# Patient Record
Sex: Female | Born: 1980 | Race: White | Hispanic: No | Smoking: Former smoker
Health system: Southern US, Community
[De-identification: ages and names within clinical notes are randomized; demographics above are authoritative.]

## PROBLEM LIST (undated history)

## (undated) DIAGNOSIS — N946 Dysmenorrhea, unspecified: Secondary | ICD-10-CM

## (undated) DIAGNOSIS — G43909 Migraine, unspecified, not intractable, without status migrainosus: Secondary | ICD-10-CM

## (undated) DIAGNOSIS — R51 Headache: Secondary | ICD-10-CM

## (undated) HISTORY — DX: Migraine, unspecified, not intractable, without status migrainosus: G43.909

## (undated) HISTORY — DX: Dysmenorrhea, unspecified: N94.6

---
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
## 2000-05-29 ENCOUNTER — Emergency Department (HOSPITAL_COMMUNITY): Admission: EM | Admit: 2000-05-29 | Discharge: 2000-05-29 | Payer: Self-pay | Admitting: Emergency Medicine

## 2000-07-09 ENCOUNTER — Emergency Department (HOSPITAL_COMMUNITY): Admission: EM | Admit: 2000-07-09 | Discharge: 2000-07-09 | Payer: Self-pay | Admitting: Emergency Medicine

## 2000-10-01 ENCOUNTER — Encounter: Payer: Self-pay | Admitting: Emergency Medicine

## 2000-10-01 ENCOUNTER — Inpatient Hospital Stay (HOSPITAL_COMMUNITY): Admission: AD | Admit: 2000-10-01 | Discharge: 2000-10-01 | Payer: Self-pay | Admitting: *Deleted

## 2000-10-04 ENCOUNTER — Other Ambulatory Visit: Admission: RE | Admit: 2000-10-04 | Discharge: 2000-10-04 | Payer: Self-pay | Admitting: *Deleted

## 2000-12-04 ENCOUNTER — Emergency Department (HOSPITAL_COMMUNITY): Admission: EM | Admit: 2000-12-04 | Discharge: 2000-12-04 | Payer: Self-pay | Admitting: Emergency Medicine

## 2001-01-21 ENCOUNTER — Ambulatory Visit (HOSPITAL_COMMUNITY): Admission: RE | Admit: 2001-01-21 | Discharge: 2001-01-21 | Payer: Self-pay

## 2001-02-17 ENCOUNTER — Inpatient Hospital Stay (HOSPITAL_COMMUNITY): Admission: AD | Admit: 2001-02-17 | Discharge: 2001-02-17 | Payer: Self-pay | Admitting: Obstetrics & Gynecology

## 2001-03-21 ENCOUNTER — Ambulatory Visit (HOSPITAL_COMMUNITY): Admission: RE | Admit: 2001-03-21 | Discharge: 2001-03-21 | Payer: Self-pay | Admitting: *Deleted

## 2001-05-07 ENCOUNTER — Inpatient Hospital Stay (HOSPITAL_COMMUNITY): Admission: AD | Admit: 2001-05-07 | Discharge: 2001-05-07 | Payer: Self-pay | Admitting: *Deleted

## 2001-06-20 ENCOUNTER — Inpatient Hospital Stay (HOSPITAL_COMMUNITY): Admission: RE | Admit: 2001-06-20 | Discharge: 2001-06-24 | Payer: Self-pay | Admitting: Obstetrics & Gynecology

## 2001-06-27 ENCOUNTER — Inpatient Hospital Stay (HOSPITAL_COMMUNITY): Admission: AD | Admit: 2001-06-27 | Discharge: 2001-06-27 | Payer: Self-pay | Admitting: Obstetrics & Gynecology

## 2001-07-07 ENCOUNTER — Encounter: Admission: RE | Admit: 2001-07-07 | Discharge: 2001-07-07 | Payer: Self-pay | Admitting: Obstetrics

## 2001-07-19 ENCOUNTER — Ambulatory Visit (HOSPITAL_COMMUNITY): Admission: RE | Admit: 2001-07-19 | Discharge: 2001-07-19 | Payer: Self-pay | Admitting: Obstetrics

## 2001-07-21 ENCOUNTER — Encounter: Admission: RE | Admit: 2001-07-21 | Discharge: 2001-07-21 | Payer: Self-pay | Admitting: Obstetrics

## 2001-07-30 ENCOUNTER — Encounter: Payer: Self-pay | Admitting: *Deleted

## 2001-07-30 ENCOUNTER — Inpatient Hospital Stay (HOSPITAL_COMMUNITY): Admission: AD | Admit: 2001-07-30 | Discharge: 2001-07-30 | Payer: Self-pay | Admitting: *Deleted

## 2001-07-31 ENCOUNTER — Observation Stay (HOSPITAL_COMMUNITY): Admission: AD | Admit: 2001-07-31 | Discharge: 2001-07-31 | Payer: Self-pay | Admitting: *Deleted

## 2001-08-11 ENCOUNTER — Encounter: Admission: RE | Admit: 2001-08-11 | Discharge: 2001-08-11 | Payer: Self-pay | Admitting: Obstetrics

## 2001-08-16 ENCOUNTER — Inpatient Hospital Stay (HOSPITAL_COMMUNITY): Admission: AD | Admit: 2001-08-16 | Discharge: 2001-08-16 | Payer: Self-pay | Admitting: *Deleted

## 2001-08-18 ENCOUNTER — Encounter: Admission: RE | Admit: 2001-08-18 | Discharge: 2001-08-18 | Payer: Self-pay | Admitting: Obstetrics

## 2001-08-25 ENCOUNTER — Encounter: Admission: RE | Admit: 2001-08-25 | Discharge: 2001-08-25 | Payer: Self-pay | Admitting: Obstetrics

## 2001-08-25 ENCOUNTER — Inpatient Hospital Stay (HOSPITAL_COMMUNITY): Admission: AD | Admit: 2001-08-25 | Discharge: 2001-08-25 | Payer: Self-pay | Admitting: Obstetrics & Gynecology

## 2001-08-28 ENCOUNTER — Inpatient Hospital Stay (HOSPITAL_COMMUNITY): Admission: AD | Admit: 2001-08-28 | Discharge: 2001-08-30 | Payer: Self-pay | Admitting: Obstetrics

## 2001-10-05 ENCOUNTER — Inpatient Hospital Stay (HOSPITAL_COMMUNITY): Admission: AD | Admit: 2001-10-05 | Discharge: 2001-10-05 | Payer: Self-pay | Admitting: *Deleted

## 2001-10-28 ENCOUNTER — Inpatient Hospital Stay (HOSPITAL_COMMUNITY): Admission: AD | Admit: 2001-10-28 | Discharge: 2001-10-28 | Payer: Self-pay | Admitting: *Deleted

## 2001-12-21 ENCOUNTER — Inpatient Hospital Stay (HOSPITAL_COMMUNITY): Admission: AD | Admit: 2001-12-21 | Discharge: 2001-12-21 | Payer: Self-pay | Admitting: Obstetrics and Gynecology

## 2002-09-13 ENCOUNTER — Other Ambulatory Visit: Admission: RE | Admit: 2002-09-13 | Discharge: 2002-09-13 | Payer: Self-pay | Admitting: Gynecology

## 2003-06-07 ENCOUNTER — Observation Stay (HOSPITAL_COMMUNITY): Admission: AD | Admit: 2003-06-07 | Discharge: 2003-06-08 | Payer: Self-pay | Admitting: Obstetrics and Gynecology

## 2003-09-01 HISTORY — PX: TUBAL LIGATION: SHX77

## 2003-12-08 ENCOUNTER — Inpatient Hospital Stay (HOSPITAL_COMMUNITY): Admission: AD | Admit: 2003-12-08 | Discharge: 2003-12-10 | Payer: Self-pay | Admitting: Obstetrics and Gynecology

## 2003-12-08 ENCOUNTER — Encounter (INDEPENDENT_AMBULATORY_CARE_PROVIDER_SITE_OTHER): Payer: Self-pay | Admitting: Specialist

## 2004-08-29 ENCOUNTER — Emergency Department (HOSPITAL_COMMUNITY): Admission: EM | Admit: 2004-08-29 | Discharge: 2004-08-29 | Payer: Self-pay | Admitting: Emergency Medicine

## 2005-02-17 ENCOUNTER — Emergency Department (HOSPITAL_COMMUNITY): Admission: EM | Admit: 2005-02-17 | Discharge: 2005-02-17 | Payer: Self-pay | Admitting: Emergency Medicine

## 2005-10-30 ENCOUNTER — Emergency Department: Payer: Self-pay | Admitting: Emergency Medicine

## 2007-03-03 ENCOUNTER — Ambulatory Visit: Payer: Self-pay | Admitting: Internal Medicine

## 2007-04-07 ENCOUNTER — Encounter (INDEPENDENT_AMBULATORY_CARE_PROVIDER_SITE_OTHER): Payer: Self-pay | Admitting: Nurse Practitioner

## 2007-04-07 ENCOUNTER — Ambulatory Visit: Payer: Self-pay | Admitting: Internal Medicine

## 2007-04-13 ENCOUNTER — Ambulatory Visit: Payer: Self-pay | Admitting: Internal Medicine

## 2007-04-21 ENCOUNTER — Ambulatory Visit: Payer: Self-pay | Admitting: Internal Medicine

## 2007-05-24 ENCOUNTER — Ambulatory Visit: Payer: Self-pay | Admitting: Nurse Practitioner

## 2007-05-25 ENCOUNTER — Encounter (INDEPENDENT_AMBULATORY_CARE_PROVIDER_SITE_OTHER): Payer: Self-pay | Admitting: Nurse Practitioner

## 2007-05-25 LAB — CONVERTED CEMR LAB: GC Probe Amp, Genital: NEGATIVE

## 2007-06-03 ENCOUNTER — Encounter (INDEPENDENT_AMBULATORY_CARE_PROVIDER_SITE_OTHER): Payer: Self-pay | Admitting: Nurse Practitioner

## 2007-08-09 ENCOUNTER — Ambulatory Visit: Payer: Self-pay | Admitting: Internal Medicine

## 2007-08-09 LAB — CONVERTED CEMR LAB: Rapid Strep: NEGATIVE

## 2007-10-27 ENCOUNTER — Emergency Department: Payer: Self-pay | Admitting: Emergency Medicine

## 2008-01-09 ENCOUNTER — Emergency Department (HOSPITAL_COMMUNITY): Admission: EM | Admit: 2008-01-09 | Discharge: 2008-01-09 | Payer: Self-pay | Admitting: Emergency Medicine

## 2009-03-18 ENCOUNTER — Emergency Department (HOSPITAL_COMMUNITY): Admission: EM | Admit: 2009-03-18 | Discharge: 2009-03-18 | Payer: Self-pay | Admitting: Emergency Medicine

## 2009-07-09 ENCOUNTER — Emergency Department: Payer: Self-pay | Admitting: Emergency Medicine

## 2010-02-02 ENCOUNTER — Emergency Department: Payer: Self-pay | Admitting: Emergency Medicine

## 2010-09-17 ENCOUNTER — Emergency Department: Payer: Self-pay | Admitting: Emergency Medicine

## 2010-09-20 ENCOUNTER — Emergency Department: Payer: Self-pay | Admitting: Emergency Medicine

## 2010-09-28 LAB — CONVERTED CEMR LAB
Beta hcg, urine, semiquantitative: NEGATIVE
Bilirubin Urine: NEGATIVE
Blood in Urine, dipstick: NEGATIVE
Protein, U semiquant: 30
Urobilinogen, UA: 0.2

## 2011-01-16 NOTE — Discharge Summary (Signed)
   NAME:  TRANNIE, BARDALES                          ACCOUNT NO.:  [id number]   MEDICAL RECORD NO.:  [id number]                   PATIENT TYPE:  INP   LOCATION:  9142                                 FACILITY:  [facility]   PHYSICIAN:  Gerrit Friends. Aldona Bar, M.D.                DATE OF BIRTH:  1981/02/19   DATE OF ADMISSION:  06/07/2003  DATE OF DISCHARGE:  06/08/2003                                 DISCHARGE SUMMARY   DISCHARGE DIAGNOSES:  1. An 11-12 week intrauterine pregnancy, undelivered.  2. Nausea, vomiting.   SUMMARY:  This 30 year old gravida 2, para 1 was admitted on the afternoon  of 06/07/2003 with nausea and vomiting for three days with a history of  hyperemesis during her first pregnancy.  Her pregnancy was documented by an  ultrasound at 8-weeks gestation.  Her EDC is April, 24, 2005.  Her  evaluation on admission revealed some ketones in her urine but otherwise her  laboratory parameters were within normal limits.  She was begun on  intravenous fluids.  A TSH and T4 were done both of which were normal.  She  did respond to IV fluids and IV Zofran.  On the morning of June 08, 2003,  her diet was increased and was tolerated well by the patient.  She required  no further Zofran on June 08, 2003.  On the evening of June 08, 2003,  again she was tolerating an advanced diet well with no nausea or vomiting  and was ready for discharge.  Accordingly she was given all appropriate  instructions.  She has followup already arranged in the office.  She was  given a prescription for Phenergan 25 mg rectal suppository, should she  begin having any nausea and vomiting, to use one per rectum every six to  eight hours as needed for severe nausea and vomiting.  She was given six  with one refill.   CONDITION ON DISCHARGE:  Improved.                                               Gerrit Friends. Aldona Bar, M.D.    RMW/MEDQ  D:  06/08/2003  T:  06/08/2003  Job:  [id number]

## 2011-01-16 NOTE — Discharge Summary (Signed)
[facility]  Patient:    Carolyn Chambers, Carolyn Chambers Visit Number: [id number] MRN: [id number]          Service Type: OBS Location: 910D [id number] 01 Attending Physician:  Antionette Char Dictated by:   Andrey Spearman Admit Date:  06/20/2001 Disc. Date: 06/24/01                             Discharge Summary  DISCHARGE DIAGNOSES:          1. Preterm contractions.                               2. Intrauterine pregnancy at 31-4/7 weeks by                                  a 9-week and 18-week ultrasound.  DISCHARGE MEDICATIONS:        1. Terbutaline 2.5 mg one p.o. q.4h.                               2. Prenatal vitamins one p.o. q.d.  BRIEF ADMISSION HISTORY AND PHYSICAL:                 This patient is a 30 year old, G1, P0, who presented at 31 weeks 0 days estimated gestational age, by a 9-week and 18-week ultrasound, complaining of contractions with intact membranes and good fetal activity. She had been cared for at [facility] with a fairly unremarkable prenatal course including a negative group B strep done on September 17.  PAST MEDICAL AND SURGICAL HISTORY:             Significant for possible history of depression.  SOCIAL HISTORY:               Significant for tobacco abuse.  PHYSICAL EXAMINATION:         Her initial exam revealed her blood pressure to be 109/62. Her cervix was fingertip and thick with a developed lower uterine segment with fetal heart rate baseline in the 140s but was reactive with no decelerations and infrequent contractions on tocometer. She had a wet prep done which was negative. Specifically, it showed few white blood cells, many bacteria, and no Trichomonas, yeast, or clue cells. She also had a urinalysis done by catheterization which was normal. However, given the lower uterine segment development, she was admitted for 23-hour observation, hydration, and tocolytics including Procardia.  HOSPITAL COURSE: #1 - PRETERM  CONTRACTIONS:    The patient was initially started on Procardia XL, however, she was unable to swallow pills so this was changed on hospital day #1 to terbutaline which she is able to chew, taking 2.5 mg initially every six hours after subcutaneous loading dose. Throughout the entire hospitalization, the patient reported no further contractions although she had felt contractions on admission. During the hospitalization, she had irregular contractile patterns, typically anywhere from one to four contractions an hour but occasionally frequent contractions as every two minutes for a 20 to 30 minute period. Again, she did not feel any of these contractions. The babys heart rate remained reactive without any decelerations throughout the hospitalization. Serial cervical exams revealed no change in her cervix, therefore, on discharge she is still considered to be fingertip, thick, with a  developed lower uterine segment. She did have a repeat group B strep done during this hospitalization which was negative. She did receive Unasyn for three days prior to discharge. On discharge home, her intrauterine pregnancy is at 31-4/7 weeks. She is instructed home on bed rest. She will continue p.o. terbutaline. She will return to [facility] on October 28 for a repeat cervix check and after that point, will follow [facility] for the rest of her prenatal care. The patient is understanding of these directions and is discharged home in stable condition. Dictated by:   Andrey Spearman Attending Physician:  Antionette Char DD:  06/24/01 TD:  06/25/01 Job: [id number]

## 2011-01-16 NOTE — Op Note (Signed)
NAME:  Carolyn Chambers, Carolyn Chambers                     ACCOUNT NO.:  [id number]   MEDICAL RECORD NO.:  [id number]                   PATIENT TYPE:  INP   LOCATION:  9123                                 FACILITY:  [facility]   PHYSICIAN:  Malva Limes, M.D.                 DATE OF BIRTH:  1980/09/15   DATE OF PROCEDURE:  12/08/2003  DATE OF DISCHARGE:                                 OPERATIVE REPORT   PREOPERATIVE DIAGNOSIS:  The patient desires permanent sterilization.   POSTOPERATIVE DIAGNOSIS:  The patient desires permanent sterilization.   PROCEDURE:  Bilateral tubal ligation, Pomeroy procedure.   SURGEON:  Malva Limes, M.D.   ANESTHESIA:  Epidural.   ANTIBIOTICS:  Ancef 1 g.   DRAINS:  Red rubber catheter to bladder.   ESTIMATED BLOOD LOSS:  Minimal.   SPECIMENS:  Portion of right and left fallopian tubes sent to pathology.   COMPLICATIONS:  None.   PROCEDURE:  The patient was taken to the operating room and placed in the  dorsal supine position.  Once an adequate anesthetic level was reached, the  patient was prepped with Hibiclens and her bladder was drained.  The patient  was then draped in the usual fashion for this procedure.  10 cc of 0.25%  Marcaine was placed in the infraumbilical region.  A 2 cm incision was made.  This was carried down the fascia.  The fascia was entered sharply.  The  parietal peritoneum was entered bluntly.  The [employer] retractors were then  placed.  The right fallopian tube was then grasped with a Babcock and  carried to a fimbriated end for identification.  A 2 cm knuckle was then  doubly ligated in the isthmic portion with 0 gut suture.  The knuckle was  excised.  Both ostia were visualized.  Hemostasis appeared to be adequate.  A similar procedure was performed on the opposite side.  Following this, the  parietal peritoneum and fascia were closed using 0 Monocryl suture in a  running fashion.  The skin was closed using 4-0 repeat in a  subcuticular  fashion.  The patient tolerated the procedure well.  She was taken to the  recovery room in stable condition.  Instrument and knife counts were correct  x1.                                               Malva Limes, M.D.    MA/MEDQ  D:  12/08/2003  T:  12/09/2003  Job:  [id number]

## 2011-01-16 NOTE — Discharge Summary (Signed)
NAME:  Carolyn Chambers, Carolyn Chambers                     ACCOUNT NO.:  [id number]   MEDICAL RECORD NO.:  [id number]                   PATIENT TYPE:  INP   LOCATION:  9123                                 [facility]ACILITY:  [facility]   PHYSICIAN:  Randye Lobo, M.D.                DATE OF BIRTH:  Feb 22, 1981   DATE OF ADMISSION:  12/08/2003  DATE OF DISCHARGE:  12/10/2003                                 DISCHARGE SUMMARY   FINAL DIAGNOSES:  1. Intrauterine pregnancy at 24 and 6/7ths weeks gestation.  2. Spontaneous vaginal delivery of a female infant with Apgars of 9/9.  3. The patient desires permanent sterilization.   PROCEDURE:  1. Spontaneous vaginal delivery performed by Dr. Malva Limes.  2. Postoperative bilateral tubal ligation performed by Dr. Malva Limes.   COMPLICATIONS:  None.   HISTORY:  This 30 year old, G2, P1-0-0-1, presents at [redacted] weeks gestation to  labor and delivery in active labor.  The patient's antepartum course had  been uncomplicated during the pregnancy.  She did measure a little bit  smaller than her dates during her pregnancy, and did have an ultrasound  performed, which showed no evidence for fetal growth restriction.  The  patient did have a group B strep culture performed in the office that was  negative.   HOSPITAL COURSE:  She presented on December 08, 2003 in active labor.  She  progressed to complete and complete.  She had a spontaneous vaginal delivery  of a 6 pound, 8 ounce female infant with Apgars of 9 and 9.  Delivery went  without complications.  There was a second degree perineal laceration that  was repaired.  The patient still expressed her desires for a tubal ligation.  Later that day, the patient was taken to the operating room by Dr. Malva Limes, with a bilateral tubal ligation was performed using the Pomeroy  procedure.  The procedure went without complication.  The patient's  postoperative course was uncomplicated.  She was felt ready for discharge on  postoperative day #2.   DIET:  The patient was sent home on a regular diet.   ACTIVITY:  Told to decrease activities.   DISCHARGE MEDICATIONS:  1. Told to continue prenatal vitamins, and her diet rich in iron, as she was     doing before, because she was having a hard time with the pills.  2. She was sent home with Percocet 1-2 q.4h. as needed for pain.  3. Told she could use over-the-counter ibuprofen if needed.   FOLLOW UP:  Was to follow up in the office in 4 weeks.   LABORATORIES ON DISCHARGE:  The patient had a hemoglobin of 9.8, white blood  cell count of 11.9.     Leilani Able, P.A.-C.                Randye Lobo, M.D.    MB/MEDQ  D:  12/24/2003  T:  12/24/2003  Job:  [id number]

## 2011-04-17 ENCOUNTER — Inpatient Hospital Stay (HOSPITAL_COMMUNITY)
Admission: AD | Admit: 2011-04-17 | Discharge: 2011-04-17 | Disposition: A | Discharge status: Home / Self Care | Payer: Self-pay | Source: Ambulatory Visit | Attending: Obstetrics & Gynecology | Admitting: Obstetrics & Gynecology

## 2011-04-17 ENCOUNTER — Encounter (HOSPITAL_COMMUNITY): Payer: Self-pay | Admitting: *Deleted

## 2011-04-17 ENCOUNTER — Inpatient Hospital Stay (HOSPITAL_COMMUNITY): Payer: Self-pay

## 2011-04-17 DIAGNOSIS — N949 Unspecified condition associated with female genital organs and menstrual cycle: Secondary | ICD-10-CM | POA: Insufficient documentation

## 2011-04-17 DIAGNOSIS — A499 Bacterial infection, unspecified: Secondary | ICD-10-CM | POA: Insufficient documentation

## 2011-04-17 DIAGNOSIS — B9689 Other specified bacterial agents as the cause of diseases classified elsewhere: Secondary | ICD-10-CM | POA: Insufficient documentation

## 2011-04-17 DIAGNOSIS — N760 Acute vaginitis: Secondary | ICD-10-CM

## 2011-04-17 DIAGNOSIS — R109 Unspecified abdominal pain: Secondary | ICD-10-CM

## 2011-04-17 HISTORY — DX: Headache: R51

## 2011-04-17 LAB — URINALYSIS, ROUTINE W REFLEX MICROSCOPIC
Bilirubin Urine: NEGATIVE
Ketones, ur: NEGATIVE mg/dL
Leukocytes, UA: NEGATIVE
Nitrite: NEGATIVE
Protein, ur: NEGATIVE mg/dL
Urobilinogen, UA: 0.2 mg/dL (ref 0.0–1.0)
pH: 6 (ref 5.0–8.0)

## 2011-04-17 MED ORDER — METRONIDAZOLE 500 MG PO TABS: 500.0000 mg | ORAL_TABLET | Freq: Two times a day (BID) | ORAL | Status: AC

## 2011-04-17 NOTE — ED Notes (Signed)
Pamelia Hoit RNP at bedside to discuss discharge, diagnosis and plan of care.

## 2011-04-17 NOTE — Progress Notes (Signed)
Pt states she had LLQ pain 8-16, no pain today. Has been having more irregular periods, shorter and more painful. Had her tubes tied 7 years ago.

## 2011-04-17 NOTE — ED Provider Notes (Signed)
History     Chief Complaint  Carolyn Chambers presents with  . Abdominal Pain   Carolyn Chambers is a 30 y.o. female presenting with abdominal pain. The history is provided by the Carolyn Chambers.  Abdominal Pain The primary symptoms of the illness include abdominal pain. The current episode started yesterday. The onset of the illness was sudden. The problem has been gradually improving.  the pt has not taken any medications for the pain.  Pt states that she had a tubal ligation after her last pregnancy, by Dr. Dareen Piano, but has not had continued GYN care due to insurance issues.  Pt says that her pain was sharp and shooting, doubling her over yesterday- hurt to walk or move.   Past Medical History  Diagnosis Date  . Headache     Past Surgical History  Procedure Date  . Tubal ligation     No family history on file.  History  Substance Use Topics  . Smoking status: Current Everyday Smoker -- 1.0 packs/day  . Smokeless tobacco: Never Used  . Alcohol Use: 0.6 oz/week    1 Shots of liquor per week    Allergies: No Known Allergies  No prescriptions prior to admission    Review of Systems  Gastrointestinal: Positive for abdominal pain.   Physical Exam   Blood pressure 106/71, pulse 116, temperature 99.1 F (37.3 C), temperature source Oral, resp. rate 16, height 5' (1.524 m), weight 86 lb 12.8 oz (39.372 kg), last menstrual period 04/06/2011, SpO2 99.00%.  Physical Exam  Vitals reviewed. Constitutional: She is oriented to person, place, and time. She appears well-developed and well-nourished.  HENT:  Head: Normocephalic.  Eyes: Pupils are equal, round, and reactive to light.  Neck: Normal range of motion. Neck supple.  Respiratory: Effort normal.  GI: Soft.  Genitourinary:       BUS negative; small- mod amount of creamy white discharge in vault; cervix clean nontender; uterus NSSC ; left adnexal tender without rebound- ?enlargement; right adnexal without tenderness or enlargement    Musculoskeletal: Normal range of motion.  Neurological: She is alert and oriented to person, place, and time.  Skin: Skin is warm and dry.  Psychiatric: She has a normal mood and affect.    MAU Course  Procedures Pelvic exam with wet prep and GC/Chlamydia done Pelvic ultrasound was normal without FF    Assessment and Plan  Pelvic pain BV Will have pt f/u with GYN clinic for dysmenorrhea and GYN care   [facility] 04/17/2011, 3:41 PM

## 2011-04-17 NOTE — ED Notes (Signed)
Pamelia Hoit RNP in ronm with pt

## 2011-04-17 NOTE — Progress Notes (Signed)
Pt states, " Yesterday I had severe pain in my left lower abd. It hurt to sit and to walk or move. I've also been having very painful periods for a couple of years."

## 2011-04-18 LAB — GC/CHLAMYDIA PROBE AMP, GENITAL
Chlamydia, DNA Probe: NEGATIVE
GC Probe Amp, Genital: NEGATIVE

## 2011-07-07 ENCOUNTER — Emergency Department: Payer: Self-pay | Admitting: Unknown Physician Specialty

## 2011-07-13 ENCOUNTER — Emergency Department: Payer: Self-pay | Admitting: Emergency Medicine

## 2011-07-30 ENCOUNTER — Emergency Department: Payer: Self-pay | Admitting: Emergency Medicine

## 2012-10-20 ENCOUNTER — Emergency Department: Payer: Self-pay | Admitting: Internal Medicine

## 2014-04-19 ENCOUNTER — Encounter (HOSPITAL_COMMUNITY): Payer: Self-pay | Admitting: Emergency Medicine

## 2014-04-19 ENCOUNTER — Emergency Department (INDEPENDENT_AMBULATORY_CARE_PROVIDER_SITE_OTHER)
Admission: EM | Admit: 2014-04-19 | Discharge: 2014-04-19 | Disposition: A | Discharge status: Home / Self Care | Payer: Self-pay | Source: Home / Self Care

## 2014-04-19 DIAGNOSIS — H9202 Otalgia, left ear: Secondary | ICD-10-CM

## 2014-04-19 DIAGNOSIS — X58XXXA Exposure to other specified factors, initial encounter: Secondary | ICD-10-CM

## 2014-04-19 DIAGNOSIS — H73893 Other specified disorders of tympanic membrane, bilateral: Secondary | ICD-10-CM

## 2014-04-19 DIAGNOSIS — H9209 Otalgia, unspecified ear: Secondary | ICD-10-CM

## 2014-04-19 DIAGNOSIS — S139XXA Sprain of joints and ligaments of unspecified parts of neck, initial encounter: Secondary | ICD-10-CM

## 2014-04-19 DIAGNOSIS — S161XXA Strain of muscle, fascia and tendon at neck level, initial encounter: Secondary | ICD-10-CM

## 2014-04-19 DIAGNOSIS — H73829 Atrophic nonflaccid tympanic membrane, unspecified ear: Secondary | ICD-10-CM

## 2014-04-19 DIAGNOSIS — J029 Acute pharyngitis, unspecified: Secondary | ICD-10-CM

## 2014-04-19 DIAGNOSIS — R0982 Postnasal drip: Secondary | ICD-10-CM

## 2014-04-19 DIAGNOSIS — J3089 Other allergic rhinitis: Secondary | ICD-10-CM

## 2014-04-19 LAB — POCT RAPID STREP A: Streptococcus, Group A Screen (Direct): NEGATIVE

## 2014-04-19 NOTE — ED Provider Notes (Signed)
Medical screening examination/treatment/procedure(s) were performed by non-physician practitioner and as supervising physician I was immediately available for consultation/collaboration.  Philipp Deputy, M.D.  Harden Mo, MD 04/19/14 [id number]

## 2014-04-19 NOTE — ED Notes (Signed)
C/o pain in left ear, sore throat x 1 week

## 2014-04-19 NOTE — ED Provider Notes (Signed)
CSN: [id number]     Arrival date & time 04/19/14  1215 History   First MD Initiated Contact with Patient 04/19/14 1342     Chief Complaint  Patient presents with  . Sore Throat   (Consider location/radiation/quality/duration/timing/severity/associated sxs/prior Treatment) HPI Comments: 33 year old female complaining of left earache with postauricular tenderness. Also with sore throat, headache, cough, PND at night and early morning  Cough.   Past Medical History  Diagnosis Date  . [id number](735.3)    Past Surgical History  Procedure Laterality Date  . Tubal ligation     History reviewed. No pertinent family history. History  Substance Use Topics  . Smoking status: Current Every Day Smoker -- 1.00 packs/day  . Smokeless tobacco: Never Used  . Alcohol Use: 0.6 oz/week    1 Shots of liquor per week   OB History   Grav Para Term Preterm Abortions TAB SAB Ect Mult Living   2 2 2       2      Review of Systems  Constitutional: Negative.  Negative for fever, chills, activity change, appetite change and fatigue.  HENT: Positive for congestion, postnasal drip and sore throat. Negative for facial swelling and rhinorrhea.   Eyes: Negative.   Respiratory: Positive for cough. Negative for shortness of breath.   Cardiovascular: Negative.   Gastrointestinal: Negative.   Musculoskeletal: Negative for neck pain and neck stiffness.  Skin: Negative for pallor and Towner.  Neurological: Negative.     Allergies  Review of patient's allergies indicates no known allergies.  Home Medications   Prior to Admission medications   Medication Sig Start Date End Date Taking? Authorizing Provider  SUMAtriptan (IMITREX) 25 MG tablet Take 25 mg by mouth 2 (two) times daily as needed. One at onset of migraine, may repeat after one hour     Historical Provider, MD   BP 100/67  Pulse 68  Temp(Src) 98.3 F (36.8 C) (Oral)  Resp 16  SpO2 97% Physical Exam  Nursing note and vitals  reviewed. Constitutional: She is oriented to person, place, and time. She appears well-developed and well-nourished. No distress.  HENT:  Head: Normocephalic.  Right Ear: External ear normal.  Left Ear: External ear normal.  Mouth/Throat: No oropharyngeal exudate.  Bilateral TMs retracted. No erythema, effusion or discoloration. Oropharynx with minor injection. Left palatine tonsil with food particles easily removed with Q-tip.  Eyes: Conjunctivae and EOM are normal.  Neck: Normal range of motion. Neck supple.  Cardiovascular: Normal rate and regular rhythm.   No murmur heard. Pulmonary/Chest: Effort normal. No respiratory distress. She has no wheezes. She has no rales.  Musculoskeletal: Normal range of motion.  Lymphadenopathy:    She has no cervical adenopathy.  Neurological: She is alert and oriented to person, place, and time. No cranial nerve deficit.  Skin: Skin is warm and dry.  Psychiatric: She has a normal mood and affect.    ED Course  Procedures (including critical care time) Labs Review Labs Reviewed  POCT RAPID STREP A ([facility] ONLY)    Imaging Review No results found. Results for orders placed during the hospital encounter of 04/19/14  POCT RAPID STREP A ([facility] ONLY)      Result Value Ref Range   Streptococcus, Group A Screen (Direct) NEGATIVE  NEGATIVE     MDM   1. Otalgia, left   2. Retraction of tympanic membrane, bilateral   3. PND (post-nasal drip)   4. Pharyngitis   5. Other allergic rhinitis  6. Cervical strain, acute, initial encounter    Lots of saline nasal spray flonase nasal spray Allegra or Claritin Increase fluids Sudafed PE 10 mg for congestion    Janne Napoleon, NP 04/19/14 1407

## 2014-04-19 NOTE — Discharge Instructions (Signed)
Allergic Rhinitis Lots of saline nasal spray flonase nasal spray Allegra or Claritin Increase fluids Sudafed PE 10 mg for congestion Allergic rhinitis is when the mucous membranes in the nose respond to allergens. Allergens are particles in the air that cause your body to have an allergic reaction. This causes you to release allergic antibodies. Through a chain of events, these eventually cause you to release histamine into the blood stream. Although meant to protect the body, it is this release of histamine that causes your discomfort, such as frequent sneezing, congestion, and an itchy, runny nose.  CAUSES  Seasonal allergic rhinitis (hay fever) is caused by pollen allergens that may come from grasses, trees, and weeds. Year-round allergic rhinitis (perennial allergic rhinitis) is caused by allergens such as house dust mites, pet dander, and mold spores.  SYMPTOMS   Nasal stuffiness (congestion).  Itchy, runny nose with sneezing and tearing of the eyes. DIAGNOSIS  Your health care provider can help you determine the allergen or allergens that trigger your symptoms. If you and your health care provider are unable to determine the allergen, skin or blood testing may be used. TREATMENT  Allergic rhinitis does not have a cure, but it can be controlled by:  Medicines and allergy shots (immunotherapy).  Avoiding the allergen. Hay fever may often be treated with antihistamines in pill or nasal spray forms. Antihistamines block the effects of histamine. There are over-the-counter medicines that may help with nasal congestion and swelling around the eyes. Check with your health care provider before taking or giving this medicine.  If avoiding the allergen or the medicine prescribed do not work, there are many new medicines your health care provider can prescribe. Stronger medicine may be used if initial measures are ineffective. Desensitizing injections can be used if medicine and avoidance does not  work. Desensitization is when a patient is given ongoing shots until the body becomes less sensitive to the allergen. Make sure you follow up with your health care provider if problems continue. HOME CARE INSTRUCTIONS It is not possible to completely avoid allergens, but you can reduce your symptoms by taking steps to limit your exposure to them. It helps to know exactly what you are allergic to so that you can avoid your specific triggers. SEEK MEDICAL CARE IF:   You have a fever.  You develop a cough that does not stop easily (persistent).  You have shortness of breath.  You start wheezing.  Symptoms interfere with normal daily activities. Document Released: 05/12/2001 Document Revised: 08/22/2013 Document Reviewed: 04/24/2013 [facility] Patient Information 2015 [place], [place]. This information is not intended to replace advice given to you by your health care provider. Make sure you discuss any questions you have with your health care provider.  Otalgia Otalgia is pain in or around the ear. When the pain is from the ear itself it is called primary otalgia. Pain may also be coming from somewhere else, like the head and neck. This is called secondary otalgia.  CAUSES  Causes of primary otalgia include:  Middle ear infection.  It can also be caused by injury to the ear or infection of the ear canal (swimmer's ear). Swimmer's ear causes pain, swelling and often drainage from the ear canal. Causes of secondary otalgia include:  Sinus infections.  Allergies and colds that cause stuffiness of the nose and tubes that drain the ears (eustachian tubes).  Dental problems like cavities, gum infections or teething.  Sore Throat (tonsillitis and pharyngitis).  Swollen glands in the  neck.  Infection of the bone behind the ear (mastoiditis).  TMJ discomfort (problems with the joint between your jaw and your skull).  Other problems such as nerve disorders, circulation problems, heart  disease and tumors of the head and neck can also cause symptoms of ear pain. This is rare. DIAGNOSIS  Evaluation, Diagnosis and Testing:  Examination by your medical caregiver is recommended to evaluate and diagnose the cause of otalgia.  Further testing or referral to a specialist may be indicated if the cause of the ear pain is not found and the symptom persists. TREATMENT   Your doctor may prescribe antibiotics if an ear infection is diagnosed.  Pain relievers and topical analgesics may be recommended.  It is important to take all medications as prescribed. HOME CARE INSTRUCTIONS   It may be helpful to sleep with the painful ear in the up position.  A warm compress over the painful ear may provide relief.  A soft diet and avoiding gum may help while ear pain is present. SEEK IMMEDIATE MEDICAL CARE IF:  You develop severe pain, a high fever, repeated vomiting or dehydration.  You develop extreme dizziness, headache, confusion, ringing in the ears (tinnitus) or hearing loss. Document Released: 09/24/2004 Document Revised: 11/09/2011 Document Reviewed: 06/26/2009 [facility] Patient Information 2015 [place], [place]. This information is not intended to replace advice given to you by your health care provider. Make sure you discuss any questions you have with your health care provider.  Pharyngitis Pharyngitis is a sore throat (pharynx). There is redness, pain, and swelling of your throat. HOME CARE   Drink enough fluids to keep your pee (urine) clear or pale yellow.  Only take medicine as told by your doctor.  You may get sick again if you do not take medicine as told. Finish your medicines, even if you start to feel better.  Do not take aspirin.  Rest.  Rinse your mouth (gargle) with salt water ( tsp of salt per 1 qt of water) every 1-2 hours. This will help the pain.  If you are not at risk for choking, you can suck on hard candy or sore throat lozenges. GET HELP  IF:  You have large, tender lumps on your neck.  You have a Bondy.  You cough up green, yellow-brown, or bloody spit. GET HELP RIGHT AWAY IF:   You have a stiff neck.  You drool or cannot swallow liquids.  You throw up (vomit) or are not able to keep medicine or liquids down.  You have very bad pain that does not go away with medicine.  You have problems breathing (not from a stuffy nose). MAKE SURE YOU:   Understand these instructions.  Will watch your condition.  Will get help right away if you are not doing well or get worse. Document Released: 02/03/2008 Document Revised: 06/07/2013 Document Reviewed: 04/24/2013 [facility] Patient Information 2015 [place], [place]. This information is not intended to replace advice given to you by your health care provider. Make sure you discuss any questions you have with your health care provider.  Sore Throat A sore throat is a painful, burning, sore, or scratchy feeling of the throat. There may be pain or tenderness when swallowing or talking. You may have other symptoms with a sore throat. These include coughing, sneezing, fever, or a swollen neck. A sore throat is often the first sign of another sickness. These sicknesses may include a cold, flu, strep throat, or an infection called mono. Most sore throats go away  without medical treatment.  HOME CARE   Only take medicine as told by your doctor.  Drink enough fluids to keep your pee (urine) clear or pale yellow.  Rest as needed.  Try using throat sprays, lozenges, or suck on hard candy (if older than 4 years or as told).  Sip warm liquids, such as broth, herbal tea, or warm water with honey. Try sucking on frozen ice pops or drinking cold liquids.  Rinse the mouth (gargle) with salt water. Mix 1 teaspoon salt with 8 ounces of water.  Do not smoke. Avoid being around others when they are smoking.  Put a humidifier in your bedroom at night to moisten the air. You can also turn on a  hot shower and sit in the bathroom for 5-10 minutes. Be sure the bathroom door is closed. GET HELP RIGHT AWAY IF:   You have trouble breathing.  You cannot swallow fluids, soft foods, or your spit (saliva).  You have more puffiness (swelling) in the throat.  Your sore throat does not get better in 7 days.  You feel sick to your stomach (nauseous) and throw up (vomit).  You have a fever or lasting symptoms for more than 2-3 days.  You have a fever and your symptoms suddenly get worse. MAKE SURE YOU:   Understand these instructions.  Will watch your condition.  Will get help right away if you are not doing well or get worse. Document Released: 05/26/2008 Document Revised: 05/11/2012 Document Reviewed: 04/24/2012 [facility] Patient Information 2015 [place], [place]. This information is not intended to replace advice given to you by your health care provider. Make sure you discuss any questions you have with your health care provider.

## 2014-04-19 NOTE — ED Notes (Signed)
Please use mobile number to contact about lab issues

## 2014-04-21 LAB — CULTURE, GROUP A STREP

## 2014-07-02 ENCOUNTER — Encounter (HOSPITAL_COMMUNITY): Payer: Self-pay | Admitting: Emergency Medicine

## 2014-07-31 ENCOUNTER — Emergency Department: Payer: Self-pay | Admitting: Emergency Medicine

## 2014-07-31 LAB — URINALYSIS, COMPLETE
Bilirubin,UR: NEGATIVE
Blood: NEGATIVE
Glucose,UR: NEGATIVE mg/dL (ref 0–75)
KETONE: NEGATIVE
NITRITE: NEGATIVE
PROTEIN: NEGATIVE
Ph: 5 (ref 4.5–8.0)
SPECIFIC GRAVITY: 1.027 (ref 1.003–1.030)
WBC UR: 9 /HPF (ref 0–5)

## 2014-07-31 LAB — COMPREHENSIVE METABOLIC PANEL
ANION GAP: 7 (ref 7–16)
AST: 21 U/L (ref 15–37)
Albumin: 3.7 g/dL (ref 3.4–5.0)
Alkaline Phosphatase: 85 U/L
BUN: 10 mg/dL (ref 7–18)
Bilirubin,Total: 0.3 mg/dL (ref 0.2–1.0)
CALCIUM: 8.4 mg/dL — AB (ref 8.5–10.1)
CHLORIDE: 107 mmol/L (ref 98–107)
CO2: 24 mmol/L (ref 21–32)
Creatinine: 0.72 mg/dL (ref 0.60–1.30)
EGFR (Non-African Amer.): >60
GLUCOSE: 114 mg/dL — AB (ref 65–99)
OSMOLALITY: 276 (ref 275–301)
Potassium: 3.9 mmol/L (ref 3.5–5.1)
SGPT (ALT): 20 U/L
Sodium: 138 mmol/L (ref 136–145)
TOTAL PROTEIN: 7 g/dL (ref 6.4–8.2)

## 2014-07-31 LAB — CBC
HCT: 42.4 % (ref 35.0–47.0)
HGB: 14.1 g/dL (ref 12.0–16.0)
MCH: 30.2 pg (ref 26.0–34.0)
MCHC: 33.3 g/dL (ref 32.0–36.0)
MCV: 91 fL (ref 80–100)
PLATELETS: 239 10*3/uL (ref 150–440)
RBC: 4.69 10*6/uL (ref 3.80–5.20)
RDW: 13.3 % (ref 11.5–14.5)
WBC: 9.6 10*3/uL (ref 3.6–11.0)

## 2014-07-31 LAB — LIPASE, BLOOD: Lipase: 128 U/L (ref 73–393)

## 2015-01-22 ENCOUNTER — Encounter: Payer: Self-pay | Admitting: Emergency Medicine

## 2015-01-22 ENCOUNTER — Emergency Department
Admission: EM | Admit: 2015-01-22 | Discharge: 2015-01-22 | Disposition: A | Discharge status: Home / Self Care | Payer: Self-pay | Attending: Student | Admitting: Student

## 2015-01-22 DIAGNOSIS — Z720 Tobacco use: Secondary | ICD-10-CM | POA: Insufficient documentation

## 2015-01-22 DIAGNOSIS — L03312 Cellulitis of back [any part except buttock]: Secondary | ICD-10-CM | POA: Insufficient documentation

## 2015-01-22 MED ORDER — DOXYCYCLINE HYCLATE 100 MG PO TABS: 100.0000 mg | ORAL_TABLET | Freq: Two times a day (BID) | ORAL | Status: DC

## 2015-01-22 NOTE — ED Provider Notes (Signed)
[facility] Emergency Department Provider Note ?____________________________________________ ? Time seen: 1933 ? I have reviewed the triage vital signs and the nursing notes. ________ HISTORY ? Chief Complaint Abscess  HPI  Carolyn Chambers is a 34 y.o. female who reports to the ED with her mother with complaints of a possible abscess to her lower back at the buttocks. She describes that she noted a bump on her backside on Thursday, about 5 days prior to arrival she scratched the area thinking it was an insect bite. Since that time the area has grown in size, and is firm to touch. She noted today a small pustule on top which ruptured spontaneously under a Band-Aid. She is here today for evaluation and management of her possible abscess. She denies fever, chills, sweats or other lesions.  Past Medical History  Diagnosis Date  . IONGEXBM(841.3)    Patient Active Problem List   Diagnosis Date Noted  . VIRAL URI 08/09/2007  . 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 SYMPTOM, ABNORMAL UNDERWEIGHT 05/19/2007  . DYSURIA 05/19/2007  . 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 Past Surgical History  Procedure Laterality Date  . Tubal ligation    ? Current Outpatient Rx  Name  Route  Sig  Dispense  Refill  . doxycycline (VIBRA-TABS) 100 MG tablet   Oral   Take 1 tablet (100 mg total) by mouth 2 (two) times daily.   20 tablet   0   . SUMAtriptan (IMITREX) 25 MG tablet   Oral   Take 25 mg by mouth 2 (two) times daily as needed. One at onset of migraine, may repeat after one hour          ? Allergies Review of patient's allergies indicates no known allergies. ? No family history on file. ? Social History History  Substance Use Topics  . Smoking status: Current Every Day Smoker -- 1.00 packs/day  . Smokeless tobacco: Never Used  . Alcohol Use: 0.6 oz/week    1 Shots of liquor per week   Review of Systems Constitutional: Negative for  fever, chills, sweats. HEENT:  Normocephalic/atraumatic. Negative for visual/hearingchanges, sore throat, or nasal congestion. Cardiovascular: Negative for chest pain. Respiratory: Negative for shortness of breath. Musculoskeletal: Negative for back pain. Skin: tender, indurated area Neurological: Negative for headaches, focal weakness or numbness. Hematological/Lymphatic:Negative for enlarged lymph nodes  10-point ROS otherwise negative. ____________________________________________  PHYSICAL EXAM:  VITAL SIGNS: ED Triage Vitals  Enc Vitals Group     BP 01/22/15 1805 107/69 mmHg     Pulse Rate 01/22/15 1805 77     Resp 01/22/15 1805 20     Temp 01/22/15 1805 98.6 F (37 C)     Temp Source 01/22/15 1805 Oral     SpO2 01/22/15 1805 98 %     Weight 01/22/15 1805 120 lb (54.432 kg)     Height 01/22/15 1805 5\' 2"  (1.575 m)     Head Cir --      Peak Flow --      Pain Score 01/22/15 1806 7     Pain Loc --      Pain Edu? --      Excl. in [place]? --    Constitutional: Alert and oriented. Well appearing and in no distress. HEENT: Normocephalic and atraumatic.Conjunctivae are normal. PERRL. Normal extraocular movements. Mucous membranes are moist. Hematological/Lymphatic/Immunilogical: No cervical lymphadenopathy. Cardiovascular: Normal rate, regular rhythm.No murmurs, rubs, or gallops.  Respiratory: Normal respiratory effort.  Gastrointestinal: Soft and  nontender. No distention.  Genitourinary: deferred Musculoskeletal: Nontender with normal range of motion in all extremities.  Neurologic:  Normal speech and language. No gross focal neurologic deficits are appreciated.  Skin:  A firm, indurated, deep, cellulitic lesion to the backside just above the gluteal cleft on the right buttock, without pointing, drainage, or  fluctuance appreciated overlying the deeper cellulitic area. Psychiatric: Mood and affect are normal. Speech and behavior are normal. Patient exhibits appropriate insight  and judgment. _____________ PROCEDURES ? Procedure(s) performed: None  Critical Care performed: None ______________________________________________________ INITIAL IMPRESSION / ASSESSMENT AND PLAN / ED COURSE ? Cellulitis without obvious fluctuance to suggest abscess formation. Suggest treatment with antibiotic and wound check in a few days as discussed for potential I&D.   ____________________________________________ FINAL CLINICAL IMPRESSION(S) / ED DIAGNOSES?  Final diagnoses:  Cellulitis of back except buttock      Melvenia Needles, PA-C 01/23/15 0040  Joanne Gavel, MD 01/23/15 2235

## 2015-01-22 NOTE — ED Notes (Signed)
Poss abcess area  To buttocks

## 2015-01-22 NOTE — Discharge Instructions (Signed)
Cellulitis Cellulitis is an infection of the skin and the tissue under the skin. The infected area is usually red and tender. This happens most often in the arms and lower legs. HOME CARE   Take your antibiotic medicine as told. Finish the medicine even if you start to feel better.  Keep the infected arm or leg raised (elevated).  Put a warm cloth on the area up to 4 times per day.  Only take medicines as told by your doctor.  Keep all doctor visits as told. GET HELP IF:  You see red streaks on the skin coming from the infected area.  Your red area gets bigger or turns a dark color.  Your bone or joint under the infected area is painful after the skin heals.  Your infection comes back in the same area or different area.  You have a puffy (swollen) bump in the infected area.  You have new symptoms.  You have a fever. GET HELP RIGHT AWAY IF:   You feel very sleepy.  You throw up (vomit) or have watery poop (diarrhea).  You feel sick and have muscle aches and pains. MAKE SURE YOU:   Understand these instructions.  Will watch your condition.  Will get help right away if you are not doing well or get worse. Document Released: 02/03/2008 Document Revised: 01/01/2014 Document Reviewed: 11/02/2011 [facility] Patient Information 2015 [place], [place]. This information is not intended to replace advice given to you by your health care provider. Make sure you discuss any questions you have with your health care provider.   Take the antibiotic as directed.  Apply warm compresses to promote healing.  Follow-up with your provider or return here for wound check as discussed.

## 2015-01-22 NOTE — ED Notes (Signed)
Pt alert and oriented X4, active, cooperative, pt in NAD. RR even and unlabored, color WNL.  Pt informed to return if any life threatening symptoms occur.   

## 2015-01-25 ENCOUNTER — Encounter: Payer: Self-pay | Admitting: Emergency Medicine

## 2015-01-25 ENCOUNTER — Emergency Department
Admission: EM | Admit: 2015-01-25 | Discharge: 2015-01-25 | Disposition: A | Discharge status: Home / Self Care | Payer: Self-pay | Attending: Emergency Medicine | Admitting: Emergency Medicine

## 2015-01-25 DIAGNOSIS — L02212 Cutaneous abscess of back [any part, except buttock]: Secondary | ICD-10-CM | POA: Insufficient documentation

## 2015-01-25 DIAGNOSIS — Z791 Long term (current) use of non-steroidal anti-inflammatories (NSAID): Secondary | ICD-10-CM | POA: Insufficient documentation

## 2015-01-25 DIAGNOSIS — Z87891 Personal history of nicotine dependence: Secondary | ICD-10-CM | POA: Insufficient documentation

## 2015-01-25 MED ORDER — LIDOCAINE-EPINEPHRINE 2 %-1:100000 IJ SOLN
1.7000 mL | Freq: Once | INTRAMUSCULAR | Status: AC
  Administered 2015-01-25: 1.7 mL via INTRADERMAL

## 2015-01-25 MED ORDER — LIDOCAINE-EPINEPHRINE 1 %-1:100000 IJ SOLN: 1.3000 mL | Freq: Once | INTRAMUSCULAR | Status: DC

## 2015-01-25 MED ORDER — LIDOCAINE-EPINEPHRINE 2 %-1:100000 IJ SOLN
INTRAMUSCULAR | Status: AC
  Administered 2015-01-25: 1.7 mL via INTRADERMAL
  Filled 2015-01-25: qty 1.7

## 2015-01-25 NOTE — ED Notes (Signed)
Pt presents to ED with c/o abscess to top of right buttock. Pt reports that she was seen here several days ago and was told to come back when "it came to a head." Pt reports that she was prescribed antibiotics and is having increased pain to the area. Pt is awake and alert during triage.

## 2015-01-25 NOTE — ED Provider Notes (Signed)
CSN: [id number]     Arrival date & time 01/25/15  1708 History   First MD Initiated Contact with Patient 01/25/15 1812     Chief Complaint  Patient presents with  . Abscess     (Consider location/radiation/quality/duration/timing/severity/associated sxs/prior Treatment) HPI   34 year old female comes in for repeat check of abscess to the right lower back. Symptoms began 8 days ago, she did report to the ER 3 days ago where she was placed on doxycycline. Patient's symptoms have worsened. Abscess has become larger and more painful. Pain is moderate, Constant, does not radiate  Patient denies any fevers, nausea, vomiting, abdominal pain. She denies any weakness in lower extremities.  Past Medical History  Diagnosis Date  . [id number](258.5)    Past Surgical History  Procedure Laterality Date  . Tubal ligation     No family history on file. History  Substance Use Topics  . Smoking status: Former Smoker -- 1.00 packs/day  . Smokeless tobacco: Never Used  . Alcohol Use: 0.6 oz/week    1 Shots of liquor per week   OB History    Gravida Para Term Preterm AB TAB SAB Ectopic Multiple Living   2 2 2       2      Review of Systems  Constitutional: Negative for fever, chills, activity change and fatigue.  HENT: Negative for congestion, sinus pressure and sore throat.   Eyes: Negative for visual disturbance.  Respiratory: Negative for cough, chest tightness and shortness of breath.   Cardiovascular: Negative for chest pain and leg swelling.  Gastrointestinal: Negative for nausea, vomiting, abdominal pain and diarrhea.  Genitourinary: Negative for dysuria.  Musculoskeletal: Negative for arthralgias and gait problem.  Skin: Positive for Mander (swollen tender area).  Neurological: Negative for weakness, numbness and headaches.  Hematological: Negative for adenopathy.  Psychiatric/Behavioral: Negative for behavioral problems, confusion and agitation.      Allergies  Review of patient's  allergies indicates no known allergies.  Home Medications   Prior to Admission medications   Medication Sig Start Date End Date Taking? Authorizing Provider  doxycycline (VIBRA-TABS) 100 MG tablet Take 1 tablet (100 mg total) by mouth 2 (two) times daily. 01/22/15  Yes Jenise V Bacon Menshew, PA-C  SUMAtriptan (IMITREX) 25 MG tablet Take 25 mg by mouth 2 (two) times daily as needed. One at onset of migraine, may repeat after one hour    Yes Historical Provider, MD   BP 112/65 mmHg  Pulse 80  Temp(Src) 98.3 F (36.8 C) (Oral)  Resp 18  Ht 5\' 2"  (1.575 m)  Wt 120 lb (54.432 kg)  BMI 21.94 kg/m2  SpO2 97%  LMP 01/25/2015 (Approximate) Physical Exam  Constitutional: She is oriented to person, place, and time. She appears well-developed and well-nourished. No distress.  HENT:  Head: Normocephalic and atraumatic.  Mouth/Throat: Oropharynx is clear and moist.  Eyes: EOM are normal. Pupils are equal, round, and reactive to light. Right eye exhibits no discharge. Left eye exhibits no discharge.  Neck: Normal range of motion. Neck supple.  Cardiovascular: Normal rate, regular rhythm and intact distal pulses.   Pulmonary/Chest: Effort normal and breath sounds normal. No respiratory distress. She exhibits no tenderness.  Abdominal: Soft. She exhibits no distension. There is no tenderness.  Musculoskeletal: Normal range of motion. She exhibits no edema.  Neurological: She is alert and oriented to person, place, and time. She has normal reflexes.  Skin: Skin is warm and dry. There is erythema.  4 x 4 centimeter  area of erythema to the right lower back towards the right SI joint. Area is fluctuant with underlying induration. No warmth or drainage. Patient is tender to palpation. No streaking  Psychiatric: She has a normal mood and affect. Her behavior is normal. Thought content normal.    ED Course  Procedures (including critical care time) INCISION AND DRAINAGE Performed by: Feliberto Gottron Consent: Verbal consent obtained. Risks and benefits: risks, benefits and alternatives were discussed Type: abscess  Body area: Right lower back SI joint  Anesthesia: local infiltration  Incision was made with a scalpel.  Local anesthetic: lidocaine 1% % with epinephrine  Anesthetic total: 1.3 ml  Complexity: complex Blunt dissection to break up loculations  Drainage: purulent  Drainage amount: 3 cc purulent yellow   Packing material: 1/4 in iodoform gauze  Patient tolerance: Patient tolerated the procedure well with no immediate complications.     Labs Review Labs Reviewed - No data to display  Imaging Review No results found.   EKG Interpretation None      MDM   Final diagnoses:  Abscess of back    Right lower back abscess. incision and drainage performed. Patient will continue with doxycycline. Follow up in 2-3 days for wound check and repacking if necessary.   Duanne Guess, PA-C 01/25/15 7035  Was available for consult during time patient is in the emergency room  Nena Polio, MD 01/25/15 2101

## 2015-01-25 NOTE — ED Notes (Signed)
Pt informed to return if any life threatening symptoms occur.  

## 2015-01-25 NOTE — Discharge Instructions (Signed)
Abscess  An abscess is an infected area that contains a collection of pus and debris.It can occur in almost any part of the body. An abscess is also known as a furuncle or boil.  CAUSES   An abscess occurs when tissue gets infected. This can occur from blockage of oil or sweat glands, infection of hair follicles, or a minor injury to the skin. As the body tries to fight the infection, pus collects in the area and creates pressure under the skin. This pressure causes pain. People with weakened immune systems have difficulty fighting infections and get certain abscesses more often.   SYMPTOMS  Usually an abscess develops on the skin and becomes a painful mass that is red, warm, and tender. If the abscess forms under the skin, you may feel a moveable soft area under the skin. Some abscesses break open (rupture) on their own, but most will continue to get worse without care. The infection can spread deeper into the body and eventually into the bloodstream, causing you to feel ill.   DIAGNOSIS   Your caregiver will take your medical history and perform a physical exam. A sample of fluid may also be taken from the abscess to determine what is causing your infection.  TREATMENT   Your caregiver may prescribe antibiotic medicines to fight the infection. However, taking antibiotics alone usually does not cure an abscess. Your caregiver may need to make a small cut (incision) in the abscess to drain the pus. In some cases, gauze is packed into the abscess to reduce pain and to continue draining the area.  HOME CARE INSTRUCTIONS    Only take over-the-counter or prescription medicines for pain, discomfort, or fever as directed by your caregiver.   If you were prescribed antibiotics, take them as directed. Finish them even if you start to feel better.   If gauze is used, follow your caregiver's directions for changing the gauze.   To avoid spreading the infection:   Keep your draining abscess covered with a  bandage.   Wash your hands well.   Do not share personal care items, towels, or whirlpools with others.   Avoid skin contact with others.   Keep your skin and clothes clean around the abscess.   Keep all follow-up appointments as directed by your caregiver.  SEEK MEDICAL CARE IF:    You have increased pain, swelling, redness, fluid drainage, or bleeding.   You have muscle aches, chills, or a general ill feeling.   You have a fever.  MAKE SURE YOU:    Understand these instructions.   Will watch your condition.   Will get help right away if you are not doing well or get worse.  Document Released: 05/27/2005 Document Revised: 02/16/2012 Document Reviewed: 10/30/2011  Exit[employer] Patient Information 2015 [employer]. This information is not intended to replace advice given to you by your health care provider. Make sure you discuss any questions you have with your health care provider.  Incision and Drainage  Incision and drainage is a procedure in which a sac-like structure (cystic structure) is opened and drained. The area to be drained usually contains material such as pus, fluid, or blood.   LET YOUR CAREGIVER KNOW ABOUT:    Allergies to medicine.   Medicines taken, including vitamins, herbs, eyedrops, over-the-counter medicines, and creams.   Use of steroids (by mouth or creams).   Previous problems with anesthetics or numbing medicines.   History of bleeding problems or blood clots.     Previous surgery.   Other health problems, including diabetes and kidney problems.   Possibility of pregnancy, if this applies.  RISKS AND COMPLICATIONS   Pain.   Bleeding.   Scarring.   Infection.  BEFORE THE PROCEDURE   You may need to have an ultrasound or other imaging tests to see how large or deep your cystic structure is. Blood tests may also be used to determine if you have an infection or how severe the infection is. You may need to have a tetanus shot.  PROCEDURE   The affected area is cleaned with a  cleaning fluid. The cyst area will then be numbed with a medicine (local anesthetic). A small incision will be made in the cystic structure. A syringe or catheter may be used to drain the contents of the cystic structure, or the contents may be squeezed out. The area will then be flushed with a cleansing solution. After cleansing the area, it is often gently packed with a gauze or another wound dressing. Once it is packed, it will be covered with gauze and tape or some other type of wound dressing.  AFTER THE PROCEDURE    Often, you will be allowed to go home right after the procedure.   You may be given antibiotic medicine to prevent or heal an infection.   If the area was packed with gauze or some other wound dressing, you will likely need to come back in 1 to 2 days to get it removed.   The area should heal in about 14 days.  Document Released: 02/10/2001 Document Revised: 02/16/2012 Document Reviewed: 10/12/2011  ExitCare Patient Information 2015 [employer]. This information is not intended to replace advice given to you by your health care provider. Make sure you discuss any questions you have with your health care provider.

## 2015-01-29 ENCOUNTER — Encounter: Payer: Self-pay | Admitting: Emergency Medicine

## 2015-01-29 ENCOUNTER — Emergency Department
Admission: EM | Admit: 2015-01-29 | Discharge: 2015-01-29 | Disposition: A | Discharge status: Home Health | Payer: Self-pay | Attending: Emergency Medicine | Admitting: Emergency Medicine

## 2015-01-29 DIAGNOSIS — Z87891 Personal history of nicotine dependence: Secondary | ICD-10-CM | POA: Insufficient documentation

## 2015-01-29 DIAGNOSIS — L02212 Cutaneous abscess of back [any part, except buttock]: Secondary | ICD-10-CM | POA: Insufficient documentation

## 2015-01-29 DIAGNOSIS — Z792 Long term (current) use of antibiotics: Secondary | ICD-10-CM | POA: Insufficient documentation

## 2015-01-29 DIAGNOSIS — Z79899 Other long term (current) drug therapy: Secondary | ICD-10-CM | POA: Insufficient documentation

## 2015-01-29 DIAGNOSIS — L0291 Cutaneous abscess, unspecified: Secondary | ICD-10-CM

## 2015-01-29 NOTE — Discharge Instructions (Signed)
Continue home medications. Continue to keep clean and apply warm compresses. Monitor at home.  Return to the ER for new or worsening concerns. With her primary care physician as needed.  Abscess An abscess is an infected area that contains a collection of pus and debris.It can occur in almost any part of the body. An abscess is also known as a furuncle or boil. CAUSES  An abscess occurs when tissue gets infected. This can occur from blockage of oil or sweat glands, infection of hair follicles, or a minor injury to the skin. As the body tries to fight the infection, pus collects in the area and creates pressure under the skin. This pressure causes pain. People with weakened immune systems have difficulty fighting infections and get certain abscesses more often.  SYMPTOMS Usually an abscess develops on the skin and becomes a painful mass that is red, warm, and tender. If the abscess forms under the skin, you may feel a moveable soft area under the skin. Some abscesses break open (rupture) on their own, but most will continue to get worse without care. The infection can spread deeper into the body and eventually into the bloodstream, causing you to feel ill.  DIAGNOSIS  Your caregiver will take your medical history and perform a physical exam. A sample of fluid may also be taken from the abscess to determine what is causing your infection. TREATMENT  Your caregiver may prescribe antibiotic medicines to fight the infection. However, taking antibiotics alone usually does not cure an abscess. Your caregiver may need to make a small cut (incision) in the abscess to drain the pus. In some cases, gauze is packed into the abscess to reduce pain and to continue draining the area. HOME CARE INSTRUCTIONS   Only take over-the-counter or prescription medicines for pain, discomfort, or fever as directed by your caregiver.  If you were prescribed antibiotics, take them as directed. Finish them even if you start to  feel better.  If gauze is used, follow your caregiver's directions for changing the gauze.  To avoid spreading the infection:  Keep your draining abscess covered with a bandage.  Wash your hands well.  Do not share personal care items, towels, or whirlpools with others.  Avoid skin contact with others.  Keep your skin and clothes clean around the abscess.  Keep all follow-up appointments as directed by your caregiver. SEEK MEDICAL CARE IF:   You have increased pain, swelling, redness, fluid drainage, or bleeding.  You have muscle aches, chills, or a general ill feeling.  You have a fever. MAKE SURE YOU:   Understand these instructions.  Will watch your condition.  Will get help right away if you are not doing well or get worse. Document Released: 05/27/2005 Document Revised: 02/16/2012 Document Reviewed: 10/30/2011 [facility] Patient Information 2015 [place], [place]. This information is not intended to replace advice given to you by your health care provider. Make sure you discuss any questions you have with your health care provider.

## 2015-01-29 NOTE — ED Notes (Signed)
Abscess i&d Friday, needs drai removed

## 2015-01-29 NOTE — ED Provider Notes (Signed)
[facility] Emergency Department Provider Note ____________________________________________  Time seen: Approximately 2050 PM  attempted to see patient prior to this time and pt not in room. I have reviewed the triage vital signs and the nursing notes.   HISTORY  Chief Complaint Wound Check    HPI Carolyn Chambers is a 34 y.o. female presents to the ER for wound check. Patient states that she was seen in the ER05/27/2016 and had I&D of an abscess drained to the top of her buttocks. Patient states that the abscess has been much improved and pain has dramatically improved. States presents today for wound check and packing removal.  States has had mild drainage and has been applying warm compresses. States continues to take home doxycycline without any side effects. States pain currently 2 out of 10 and described as sore. Denies other complaints.   Past Medical History  Diagnosis Date  . 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 VIRAL URI 08/09/2007  . 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 SYMPTOM, ABNORMAL UNDERWEIGHT 05/19/2007  . DYSURIA 05/19/2007  . HERPES SIMPLEX 04/18/2007    Past Surgical History  Procedure Laterality Date  . Tubal ligation    . Tubal ligation      Current Outpatient Rx  Name  Route  Sig  Dispense  Refill  . doxycycline (VIBRA-TABS) 100 MG tablet   Oral   Take 1 tablet (100 mg total) by mouth 2 (two) times daily.   20 tablet   0   . SUMAtriptan (IMITREX) 25 MG tablet   Oral   Take 25 mg by mouth 2 (two) times daily as needed. One at onset of migraine, may repeat after one hour            Allergies Review of patient's allergies indicates no known allergies.  No family history on file.  Social History History  Substance Use Topics  . Smoking status: Former Smoker -- 1.00 packs/day  . Smokeless tobacco: Never Used  . Alcohol Use: 0.6 oz/week    1  Shots of liquor per week    Review of Systems Constitutional: No fever/chills Eyes: No visual changes. ENT: No sore throat. Cardiovascular: Denies chest pain. Respiratory: Denies shortness of breath. Gastrointestinal: No abdominal pain.  No nausea, no vomiting.  No diarrhea.  No constipation. Genitourinary: Negative for dysuria. Musculoskeletal: Negative for back pain. Skin: Negative for [place]. Neurological: Negative for headaches, focal weakness or numbness.  10-point ROS otherwise negative.  ____________________________________________   PHYSICAL EXAM:  VITAL SIGNS: ED Triage Vitals  Enc Vitals Group     BP 01/29/15 1714 110/60 mmHg     Pulse Rate 01/29/15 1714 80     Resp 01/29/15 1714 16     Temp 01/29/15 1714 98.9 F (37.2 C)     Temp Source 01/29/15 1714 Oral     SpO2 01/29/15 1714 100 %     Weight 01/29/15 1714 120 lb (54.432 kg)     Height 01/29/15 1714 5\' 2"  (1.575 m)     Head Cir --      Peak Flow --      Pain Score 01/29/15 1723 0     Pain Loc --      Pain Edu? --      Excl. in [place]? --     Constitutional: Alert and oriented. Well appearing and in no acute distress. Eyes: Conjunctivae are normal.  Head: Atraumatic.  Cardiovascular: Normal rate, regular rhythm. Grossly normal heart sounds.  Good peripheral circulation. Respiratory: Normal respiratory effort.  No retractions. Lungs CTAB. Gastrointestinal: Soft and nontender.  Musculoskeletal: Steady gait. Changes positions quickly without pain or distress. Skin:  Skin is warm, dry and intact. No Henderson noted. Except: Lower back approximately at the eye joint slightly to the right of midline healing abscess with packing present. No active drainage. No surrounding erythema. Very mild induration present. No fluctuance. Packing removed. Patient tolerated well Psychiatric: Mood and affect are normal. Speech and behavior are normal.  ___________________________________   PROCEDURES  Procedure(s) performed:   Healing abscess present with packing present. Packing removed. Patient tolerated well. No indication for repacking.  ____________________________________________   INITIAL IMPRESSION / ASSESSMENT AND PLAN / ED COURSE  Pertinent labs & imaging results that were available during my care of the patient were reviewed by me and considered in my medical decision making (see chart for details).  Very well-appearing patient. Presents for wound check and packing removal of lower back/upper buttocks abscess around SI joint. Packing removed. No indication for repacking.  Discussed follow-up with primary care physician. Discussed monitoring wound at home. Continue taking home antibiotics and keep clean. Discussed follow-up and return parameters. Patient agreed to plan. ____________________________________________   FINAL CLINICAL IMPRESSION(S) / ED DIAGNOSES  Final diagnoses:  Abscess  Abscess of lower back, subsequent    Marylene Land, NP 01/29/15 2107  Lisa Roca, MD 01/30/15 [phone]

## 2015-07-12 ENCOUNTER — Encounter: Payer: Self-pay | Admitting: Family Medicine

## 2015-07-12 ENCOUNTER — Ambulatory Visit (INDEPENDENT_AMBULATORY_CARE_PROVIDER_SITE_OTHER): Payer: 59 | Admitting: Family Medicine

## 2015-07-12 VITALS — BP 100/62 | HR 80 | Temp 98.9°F | Ht 61.0 in | Wt 113.4 lb

## 2015-07-12 DIAGNOSIS — Z23 Encounter for immunization: Secondary | ICD-10-CM | POA: Diagnosis not present

## 2015-07-12 DIAGNOSIS — N946 Dysmenorrhea, unspecified: Secondary | ICD-10-CM | POA: Diagnosis not present

## 2015-07-12 DIAGNOSIS — L299 Pruritus, unspecified: Secondary | ICD-10-CM | POA: Insufficient documentation

## 2015-07-12 DIAGNOSIS — Z0000 Encounter for general adult medical examination without abnormal findings: Secondary | ICD-10-CM

## 2015-07-12 DIAGNOSIS — Z13220 Encounter for screening for lipoid disorders: Secondary | ICD-10-CM

## 2015-07-12 LAB — CBC
HCT: 42.7 % (ref 36.0–46.0)
Hemoglobin: 14 g/dL (ref 12.0–15.0)
MCHC: 32.9 g/dL (ref 30.0–36.0)
MCV: 90.7 fl (ref 78.0–100.0)
PLATELETS: 266 10*3/uL (ref 150.0–400.0)
RBC: 4.7 Mil/uL (ref 3.87–5.11)
RDW: 12.7 % (ref 11.5–15.5)
WBC: 8.4 10*3/uL (ref 4.0–10.5)

## 2015-07-12 LAB — COMPREHENSIVE METABOLIC PANEL
ALT: 13 U/L (ref 0–35)
AST: 18 U/L (ref 0–37)
Albumin: 4.4 g/dL (ref 3.5–5.2)
Alkaline Phosphatase: 60 U/L (ref 39–117)
BILIRUBIN TOTAL: 0.6 mg/dL (ref 0.2–1.2)
BUN: 9 mg/dL (ref 6–23)
CALCIUM: 9.2 mg/dL (ref 8.4–10.5)
CHLORIDE: 104 meq/L (ref 96–112)
CO2: 28 meq/L (ref 19–32)
Creatinine, Ser: 0.72 mg/dL (ref 0.40–1.20)
GFR: 98.34 mL/min (ref >60.00)
Glucose, Bld: 95 mg/dL (ref 70–99)
Potassium: 4.2 mEq/L (ref 3.5–5.1)
Sodium: 138 mEq/L (ref 135–145)
Total Protein: 7.2 g/dL (ref 6.0–8.3)

## 2015-07-12 LAB — LIPID PANEL
CHOL/HDL RATIO: 4
Cholesterol: 167 mg/dL (ref 0–200)
HDL: 46.4 mg/dL (ref >39.00)
LDL Cholesterol: 109 mg/dL — ABNORMAL HIGH (ref 0–99)
NonHDL: 120.66
TRIGLYCERIDES: 56 mg/dL (ref 0.0–149.0)
VLDL: 11.2 mg/dL (ref 0.0–40.0)

## 2015-07-12 LAB — TSH: TSH: 1.48 u[IU]/mL (ref 0.35–4.50)

## 2015-07-12 MED ORDER — PERMETHRIN 5 % EX CREA: 1.0000 "application " | TOPICAL_CREAM | Freq: Once | CUTANEOUS | Status: DC

## 2015-07-12 MED ORDER — HYDROXYZINE HCL 25 MG PO TABS: 25.0000 mg | ORAL_TABLET | Freq: Three times a day (TID) | ORAL | Status: DC | PRN

## 2015-07-12 NOTE — Progress Notes (Signed)
Pre visit review using our clinic review tool, if applicable. No additional management support is needed unless otherwise documented below in the visit note. 

## 2015-07-12 NOTE — Progress Notes (Signed)
Subjective:  Patient ID: Carolyn Chambers, female    DOB: 1980-12-18  Age: 34 y.o. MRN: [id number]  CC: Establish care; Itching  HPI Carolyn Chambers is a 34 y.o. female presents to the clinic today to establish care. She also reports itching.  1) Preventative Healthcare  Pap smear: Approximately one week ago in [place]. Will need to obtain records.  Immunizations: In need of Tdap and flu.  Labs: Screening labs today: CBC, CMP, lipids, TSH.  Alcohol use: See below.  Smoking/tobacco use: See below.  2) Itching  Patient reports a 2-3 week history of diffuse itching.  She states that she has severe itching of the stomach, back, arms, legs.  She denies any [place].  She does state that approximately 4 weeks ago she and her family were exposed to scabies and they all were treated  No new exposures. No medication changes. No other symptoms.  No exacerbating or relieving factors. No interventions tried.  PMH, Surgical Hx, Family Hx, Social History reviewed and updated as below.  Past Medical History  Diagnosis Date  . Headache(784.0)   . Dysmenorrhea    Past Surgical History  Procedure Laterality Date  . Tubal ligation  2005   Family History  Problem Relation Age of Onset  . Hypertension Father   . ADD / ADHD Brother   . Emphysema Maternal Grandmother   . Cancer Maternal Grandfather     colon  . Alcohol abuse Maternal Grandfather   . Liver cancer Maternal Grandfather   . Heart attack Paternal Grandfather   . Diabetes Paternal Grandfather   . Hypertension Paternal Grandfather    Social History  Substance Use Topics  . Smoking status: Former Smoker -- 1.00 packs/day  . Smokeless tobacco: Never Used  . Alcohol Use: 0.6 oz/week    1 Shots of liquor per week    Review of Systems  Skin:       Itching.  All other systems reviewed and are negative.   Objective:   Today's Vitals: BP 100/62 mmHg  Pulse 80  Temp(Src) 98.9 F (37.2 C) (Oral)  Ht 5\' 1"  (1.549 m)  Wt  113 lb 6 oz (51.427 kg)  BMI 21.43 kg/m2  SpO2 97%  LMP   Physical Exam  Constitutional: She is oriented to person, place, and time. She appears well-developed and well-nourished. No distress.  HENT:  Head: Normocephalic and atraumatic.  Nose: Nose normal.  Mouth/Throat: Oropharynx is clear and moist. No oropharyngeal exudate.  Normal TM's bilaterally.   Eyes: Conjunctivae are normal. No scleral icterus.  Neck: Neck supple. No thyromegaly present.  Cardiovascular: Normal rate and regular rhythm.   No murmur heard. Pulmonary/Chest: Effort normal and breath sounds normal. She has no wheezes. She has no rales.  Abdominal: Soft. She exhibits no distension. There is no tenderness. There is no rebound and no guarding.  Musculoskeletal: Normal range of motion. She exhibits no edema.  Lymphadenopathy:    She has no cervical adenopathy.  Neurological: She is alert and oriented to person, place, and time.  Skin: Skin is warm and dry.  No focal [place]. She does have some excoriation noted around her umbilicus.  Psychiatric: She has a normal mood and affect.  Vitals reviewed.  Assessment & Plan:   Problem List Items Addressed This Visit    Preventative health care - Primary    Patient reports that her Pap smear is up-to-date. Will obtain records. Tdap and influenza vaccines given today. Screening labs today: CBC, CMP, lipid, TSH.  Itching    Unclear etiology. No focal He[place]. Given recent exposure and treatment for scabies, I am treating her an additional time to ensure resolution. Hydroxyzine given for itching. Follow-up if fails to improve or worsens.      Relevant Orders   Comprehensive metabolic panel   Dysmenorrhea   Relevant Orders   CBC   TSH    Other Visit Diagnoses    Need for prophylactic vaccination with combined vaccine        Relevant Orders    Tdap vaccine greater than or equal to 7yo IM (Completed)    Screening, lipid        Relevant Orders    Lipid panel         Outpatient Encounter Prescriptions as of 07/12/2015  Medication Sig  . cetirizine (ZYRTEC) 10 MG tablet Take 10 mg by mouth daily.  . [DISCONTINUED] cetirizine (ZYRTEC) 5 MG tablet Take 5 mg by mouth daily.  . hydrOXYzine (ATARAX/VISTARIL) 25 MG tablet Take 1 tablet (25 mg total) by mouth 3 (three) times daily as needed.  . permethrin (ELIMITE) 5 % cream Apply 1 application topically once. Thoroughly massage cream  from head to soles of feet; leave on for 8 to 14 hours before removing (shower or bath)  . [DISCONTINUED] doxycycline (VIBRA-TABS) 100 MG tablet Take 1 tablet (100 mg total) by mouth 2 (two) times daily.  . [DISCONTINUED] SUMAtriptan (IMITREX) 25 MG tablet Take 25 mg by mouth 2 (two) times daily as needed. One at onset of migraine, may repeat after one hour    No facility-administered encounter medications on file as of 07/12/2015.    Follow-up: Annually; Sooner if needed.  [place]

## 2015-07-12 NOTE — Assessment & Plan Note (Addendum)
Patient reports that her Pap smear is up-to-date. Will obtain records. Tdap and influenza vaccines given today. Screening labs today: CBC, CMP, lipid, TSH.

## 2015-07-12 NOTE — Assessment & Plan Note (Signed)
Unclear etiology. No focal Shiraishi. Given recent exposure and treatment for scabies, I am treating her an additional time to ensure resolution. Hydroxyzine given for itching. Follow-up if fails to improve or worsens.

## 2015-07-12 NOTE — Patient Instructions (Signed)
Use the permethrin as indicated to ensure no additional scabies.  Use the hydroxyzine for itching.  Follow up annually or sooner if needed.  Take care  Dr. Lacinda Axon

## 2015-09-05 ENCOUNTER — Ambulatory Visit (INDEPENDENT_AMBULATORY_CARE_PROVIDER_SITE_OTHER): Payer: 59 | Admitting: Family Medicine

## 2015-09-05 ENCOUNTER — Encounter: Payer: Self-pay | Admitting: Family Medicine

## 2015-09-05 VITALS — BP 100/54 | HR 88 | Temp 98.3°F | Ht 61.0 in | Wt 115.8 lb

## 2015-09-05 DIAGNOSIS — J069 Acute upper respiratory infection, unspecified: Secondary | ICD-10-CM | POA: Diagnosis not present

## 2015-09-05 DIAGNOSIS — B9789 Other viral agents as the cause of diseases classified elsewhere: Principal | ICD-10-CM

## 2015-09-05 MED ORDER — PREDNISONE 50 MG PO TABS: ORAL_TABLET | ORAL | Status: DC

## 2015-09-05 MED ORDER — HYDROCOD POLST-CPM POLST ER 10-8 MG/5ML PO SUER: 5.0000 mL | Freq: Two times a day (BID) | ORAL | Status: DC | PRN

## 2015-09-05 NOTE — Progress Notes (Signed)
Pre visit review using our clinic review tool, if applicable. No additional management support is needed unless otherwise documented below in the visit note. 

## 2015-09-05 NOTE — Progress Notes (Signed)
Subjective:  Patient ID: Carolyn Chambers, female    DOB: November 10, 1980  Age: 35 y.o. MRN: [id number]  CC: Cough, congestion  HPI:  35 year old female presents to clinic today for an acute visit with complaints of cold symptoms.  Patient states that she's been sick since Monday. She's been experiencing headache, nasal congestion and runny nose, chest tightness and cough. She states that she's had numerous sick contacts. She reports chills but denies fever. She's been taking DayQuil and NyQuil for her symptoms with little relief. Additionally, she reports associated weakness and body aches. No known exacerbating factors.  Social Hx   Social History   Social History  . Marital Status: Single    Spouse Name: N/A  . Number of Children: N/A  . Years of Education: N/A   Social History Main Topics  . Smoking status: Former Smoker -- 1.00 packs/day  . Smokeless tobacco: Never Used  . Alcohol Use: 0.6 oz/week    1 Shots of liquor per week  . Drug Use: No  . Sexual Activity: Yes    Birth Control/ Protection: Surgical   Other Topics Concern  . None   Social History Narrative   Review of Systems  Constitutional: Positive for chills. Negative for fever.  HENT: Positive for congestion, rhinorrhea and sore throat.   Respiratory: Positive for cough and chest tightness.    Objective:  BP 100/54 mmHg  Pulse 88  Temp(Src) 98.3 F (36.8 C) (Oral)  Ht 5\' 1"  (1.549 m)  Wt 115 lb 12 oz (52.504 kg)  BMI 21.88 kg/m2  SpO2 98%  BP/Weight 09/05/2015 07/12/2015 [id number]  Systolic BP 123XX123 123XX123 [id number]  Diastolic BP 54 62 56  Wt. (Lbs) 115.75 113.38 120  BMI 21.88 21.43 21.94   Physical Exam  Constitutional: She appears well-developed. No distress.  HENT:  Head: Normocephalic and atraumatic.  Oropharynx with mild erythema. No exudate. Normal TMs bilaterally.  Eyes: Conjunctivae are normal. Right eye exhibits no discharge. Left eye exhibits no discharge.  Neck: Neck supple.  Cardiovascular: Normal  rate and regular rhythm.   No murmur heard. Pulmonary/Chest: Effort normal and breath sounds normal. No respiratory distress. She has no wheezes. She has no rales.  Lymphadenopathy:    She has no cervical adenopathy.  Neurological: She is alert.  Vitals reviewed.  Lab Results  Component Value Date   WBC 8.4 07/12/2015   HGB 14.0 07/12/2015   HCT 42.7 07/12/2015   PLT 266.0 07/12/2015   GLUCOSE 95 07/12/2015   CHOL 167 07/12/2015   TRIG 56.0 07/12/2015   HDL 46.40 07/12/2015   LDLCALC 109* 07/12/2015   ALT 13 07/12/2015   AST 18 07/12/2015   NA 138 07/12/2015   K 4.2 07/12/2015   CL 104 07/12/2015   CREATININE 0.72 07/12/2015   BUN 9 07/12/2015   CO2 28 07/12/2015   TSH 1.48 07/12/2015   Assessment & Plan:   Problem List Items Addressed This Visit    Viral URI with cough - Primary    New problem. Unremarkable exam. No indications for antibiotics at this time. Given severity of symptoms, treating with prednisone and Tussionex. Patient to follow-up if she fails to improve or worsens.         Meds ordered this encounter  Medications  . predniSONE (DELTASONE) 50 MG tablet    Sig: 1 tablet daily x 5 days.    Dispense:  5 tablet    Refill:  0  . chlorpheniramine-HYDROcodone (TUSSIONEX PENNKINETIC ER) 10-8 MG/5ML SUER  Sig: Take 5 mLs by mouth every 12 (twelve) hours as needed.    Dispense:  115 mL    Refill:  0    Follow-up: Return if symptoms worsen or fail to improve.  [place]

## 2015-09-05 NOTE — Assessment & Plan Note (Signed)
New problem. Unremarkable exam. No indications for antibiotics at this time. Given severity of symptoms, treating with prednisone and Tussionex. Patient to follow-up if she fails to improve or worsens.

## 2015-09-05 NOTE — Patient Instructions (Signed)
This is viral.  Take the prednisone and use the cough syrup as needed.  Please let me know if you worsen or fail to improve.  Take care  Dr. Lacinda Axon

## 2015-10-17 ENCOUNTER — Other Ambulatory Visit: Payer: Self-pay | Admitting: Obstetrics and Gynecology

## 2016-05-01 ENCOUNTER — Encounter: Payer: Self-pay | Admitting: Family Medicine

## 2016-05-01 ENCOUNTER — Ambulatory Visit (INDEPENDENT_AMBULATORY_CARE_PROVIDER_SITE_OTHER): Payer: Managed Care, Other (non HMO) | Admitting: Family Medicine

## 2016-05-01 VITALS — BP 102/60 | HR 72 | Temp 98.1°F | Resp 16 | Wt 121.0 lb

## 2016-05-01 DIAGNOSIS — Z23 Encounter for immunization: Secondary | ICD-10-CM | POA: Diagnosis not present

## 2016-05-01 DIAGNOSIS — G44209 Tension-type headache, unspecified, not intractable: Secondary | ICD-10-CM | POA: Insufficient documentation

## 2016-05-01 MED ORDER — BUTALBITAL-APAP-CAFFEINE 50-325-40 MG PO TABS: 1.0000 | ORAL_TABLET | Freq: Four times a day (QID) | ORAL | 0 refills | Status: AC | PRN

## 2016-05-01 NOTE — Progress Notes (Signed)
Subjective:  Patient ID: Carolyn Chambers, female    DOB: 08-Aug-1981  Age: 35 y.o. MRN: [id number]  CC: Headache  HPI:  35 year old female presents with complaints of headache.  Patient states that she has had a headache since Monday. She states that the headache is severe (7/10). Pain is located behind the eyes and in the posterior neck. She's had associated nausea. She reports phonophobia but denies photophobia. She states that she's had migraines in the past. She has used over-the-counter sinus medication, Excedrin Migraine, and some Percocet from a friend without significant improvement. She she states that this feels different than migraines that she's had in the past. No other associated symptoms. No reports of vision changes or loss. No focal weakness. No other complaints at this time.  Social Hx   Social History   Social History  . Marital status: Single    Spouse name: N/A  . Number of children: N/A  . Years of education: N/A   Social History Main Topics  . Smoking status: Former Smoker    Packs/day: 1.00  . Smokeless tobacco: Never Used  . Alcohol use 0.6 oz/week    1 Shots of liquor per week     Comment: occasionally  . Drug use: No  . Sexual activity: Yes    Birth control/ protection: Surgical   Other Topics Concern  . None   Social History Narrative  . None   Review of Systems  Eyes: Negative.   Neurological: Positive for headaches.   Objective:  BP 102/60 (BP Location: Right Arm, Patient Position: Sitting, Cuff Size: Normal)   Pulse 72   Temp 98.1 F (36.7 C) (Oral)   Resp 16   Wt 121 lb (54.9 kg)   LMP 04/10/2016   BMI 22.86 kg/m   BP/Weight 05/01/2016 09/05/2015 [id number]  Systolic BP A999333 123XX123 123XX123  Diastolic BP 60 54 62  Wt. (Lbs) 121 115.75 113.38  BMI 22.86 21.88 21.43   Physical Exam  Constitutional: She is oriented to person, place, and time. She appears well-developed. No distress.  Eyes: Conjunctivae and EOM are normal. Pupils are equal,  round, and reactive to light.  Cardiovascular: Normal rate and regular rhythm.   Pulmonary/Chest: Effort normal and breath sounds normal. She has no wheezes. She has no rales.  Neurological: She is alert and oriented to person, place, and time.  Cranial nerves intact. Normal muscle strength. No focal neurological deficits.  Vitals reviewed.  Lab Results  Component Value Date   WBC 8.4 07/12/2015   HGB 14.0 07/12/2015   HCT 42.7 07/12/2015   PLT 266.0 07/12/2015   GLUCOSE 95 07/12/2015   CHOL 167 07/12/2015   TRIG 56.0 07/12/2015   HDL 46.40 07/12/2015   LDLCALC 109 (H) 07/12/2015   ALT 13 07/12/2015   AST 18 07/12/2015   NA 138 07/12/2015   K 4.2 07/12/2015   CL 104 07/12/2015   CREATININE 0.72 07/12/2015   BUN 9 07/12/2015   CO2 28 07/12/2015   TSH 1.48 07/12/2015    Assessment & Plan:   Problem List Items Addressed This Visit    Tension headache    New problem. Patient appears to be experiencing tension headache. Treating with Fioricet.      Relevant Medications   butalbital-acetaminophen-caffeine (FIORICET) 50-325-40 MG tablet    Other Visit Diagnoses    Encounter for immunization       Relevant Orders   Flu Vaccine QUAD 36+ mos IM (Completed)  Meds ordered this encounter  Medications  . butalbital-acetaminophen-caffeine (FIORICET) 50-325-40 MG tablet    Sig: Take 1-2 tablets by mouth every 6 (six) hours as needed for headache. Do not exceed 6 pills in 24 hours.    Dispense:  20 tablet    Refill:  0    Follow-up: Return if symptoms worsen or fail to improve.  [place]

## 2016-05-01 NOTE — Patient Instructions (Signed)
Use the medication as directed.  Follow up as needed.  Take care  Dr. Lacinda Axon

## 2016-05-01 NOTE — Assessment & Plan Note (Signed)
New problem. Patient appears to be experiencing tension headache. Treating with Fioricet.

## 2016-09-22 ENCOUNTER — Ambulatory Visit (INDEPENDENT_AMBULATORY_CARE_PROVIDER_SITE_OTHER): Payer: Managed Care, Other (non HMO) | Admitting: Family

## 2016-09-22 ENCOUNTER — Encounter: Payer: Self-pay | Admitting: Family

## 2016-09-22 VITALS — BP 100/58 | HR 77 | Temp 98.5°F | Ht 61.0 in | Wt 133.4 lb

## 2016-09-22 DIAGNOSIS — R21 Rash and other nonspecific skin eruption: Secondary | ICD-10-CM

## 2016-09-22 DIAGNOSIS — M545 Low back pain, unspecified: Secondary | ICD-10-CM | POA: Insufficient documentation

## 2016-09-22 MED ORDER — CLOTRIMAZOLE 1 % EX CREA: 1.0000 "application " | TOPICAL_CREAM | Freq: Two times a day (BID) | CUTANEOUS | 1 refills | Status: DC

## 2016-09-22 MED ORDER — CYCLOBENZAPRINE HCL 10 MG PO TABS: 10.0000 mg | ORAL_TABLET | Freq: Every evening | ORAL | 0 refills | Status: DC | PRN

## 2016-09-22 NOTE — Assessment & Plan Note (Signed)
Symptoms consistent with candida. Trial of antifungal.

## 2016-09-22 NOTE — Assessment & Plan Note (Signed)
Symptoms consistent with spasm, sprain. Advised PRN flexeril , avoiding exacerbating movements, heat, and exercise. Will consider prednisone and imaging if persists.

## 2016-09-22 NOTE — Patient Instructions (Signed)
Suspect muscle strain and spasm. may take Flexeril as needed. Do not take this medication with alcohol or operate machinery with it As we talked about, exercises is of the utmost importance. Let me know if it does not improve with conservative treatment.  antifungal cream for your [place].    Low Back Sprain Rehab Ask your health care provider which exercises are safe for you. Do exercises exactly as told by your health care provider and adjust them as directed. It is normal to feel mild stretching, pulling, tightness, or discomfort as you do these exercises, but you should stop right away if you feel sudden pain or your pain gets worse. Do not begin these exercises until told by your health care provider. Stretching and range of motion exercises These exercises warm up your muscles and joints and improve the movement and flexibility of your back. These exercises also help to relieve pain, numbness, and tingling. Exercise A: Lumbar rotation 1. Lie on your back on a firm surface and bend your knees. 2. Straighten your arms out to your sides so each arm forms an "L" shape with a side of your body (a 90 degree angle). 3. Slowly move both of your knees to one side of your body until you feel a stretch in your lower back. Try not to let your shoulders move off of the floor. 4. Hold for __________ seconds. 5. Tense your abdominal muscles and slowly move your knees back to the starting position. 6. Repeat this exercise on the other side of your body. Repeat __________ times. Complete this exercise __________ times a day. Exercise B: Prone extension on elbows 1. Lie on your abdomen on a firm surface. 2. Prop yourself up on your elbows. 3. Use your arms to help lift your chest up until you feel a gentle stretch in your abdomen and your lower back.  This will place some of your body weight on your elbows. If this is uncomfortable, try stacking pillows under your chest.  Your hips should stay down,  against the surface that you are lying on. Keep your hip and back muscles relaxed. 4. Hold for __________ seconds. 5. Slowly relax your upper body and return to the starting position. Repeat __________ times. Complete this exercise __________ times a day. Strengthening exercises These exercises build strength and endurance in your back. Endurance is the ability to use your muscles for a long time, even after they get tired. Exercise C: Pelvic tilt 1. Lie on your back on a firm surface. Bend your knees and keep your feet flat. 2. Tense your abdominal muscles. Tip your pelvis up toward the ceiling and flatten your lower back into the floor.  To help with this exercise, you may place a small towel under your lower back and try to push your back into the towel. 3. Hold for __________ seconds. 4. Let your muscles relax completely before you repeat this exercise. Repeat __________ times. Complete this exercise __________ times a day. Exercise D: Alternating arm and leg raises 1. Get on your hands and knees on a firm surface. If you are on a hard floor, you may want to use padding to cushion your knees, such as an exercise mat. 2. Line up your arms and legs. Your hands should be below your shoulders, and your knees should be below your hips. 3. Lift your left leg behind you. At the same time, raise your right arm and straighten it in front of you.  Do not lift your  leg higher than your hip.  Do not lift your arm higher than your shoulder.  Keep your abdominal and back muscles tight.  Keep your hips facing the ground.  Do not arch your back.  Keep your balance carefully, and do not hold your breath. 4. Hold for __________ seconds. 5. Slowly return to the starting position and repeat with your right leg and your left arm. Repeat __________ times. Complete this exercise __________ times a day. Exercise E: Abdominal set with straight leg raise 1. Lie on your back on a firm surface. 2. Bend  one of your knees and keep your other leg straight. 3. Tense your abdominal muscles and lift your straight leg up, 4-6 inches (10-15 cm) off the ground. 4. Keep your abdominal muscles tight and hold for __________ seconds.  Do not hold your breath.  Do not arch your back. Keep it flat against the ground. 5. Keep your abdominal muscles tense as you slowly lower your leg back to the starting position. 6. Repeat with your other leg. Repeat __________ times. Complete this exercise __________ times a day. Posture and body mechanics   Body mechanics refers to the movements and positions of your body while you do your daily activities. Posture is part of body mechanics. Good posture and healthy body mechanics can help to relieve stress in your body's tissues and joints. Good posture means that your spine is in its natural S-curve position (your spine is neutral), your shoulders are pulled back slightly, and your head is not tipped forward. The following are general guidelines for applying improved posture and body mechanics to your everyday activities. Standing   When standing, keep your spine neutral and your feet about hip-width apart. Keep a slight bend in your knees. Your ears, shoulders, and hips should line up.  When you do a task in which you stand in one place for a long time, place one foot up on a stable object that is 2-4 inches (5-10 cm) high, such as a footstool. This helps keep your spine neutral. Sitting  When sitting, keep your spine neutral and keep your feet flat on the floor. Use a footrest, if necessary, and keep your thighs parallel to the floor. Avoid rounding your shoulders, and avoid tilting your head forward.  When working at a desk or a computer, keep your desk at a height where your hands are slightly lower than your elbows. Slide your chair under your desk so you are close enough to maintain good posture.  When working at a computer, place your monitor at a height where  you are looking straight ahead and you do not have to tilt your head forward or downward to look at the screen. Resting   When lying down and resting, avoid positions that are most painful for you.  If you have pain with activities such as sitting, bending, stooping, or squatting (flexion-based activities), lie in a position in which your body does not bend very much. For example, avoid curling up on your side with your arms and knees near your chest (fetal position).  If you have pain with activities such as standing for a long time or reaching with your arms (extension-based activities), lie with your spine in a neutral position and bend your knees slightly. Try the following positions:  Lying on your side with a pillow between your knees.  Lying on your back with a pillow under your knees. Lifting   When lifting objects, keep your feet at least  shoulder-width apart and tighten your abdominal muscles.  Bend your knees and hips and keep your spine neutral. It is important to lift using the strength of your legs, not your back. Do not lock your knees straight out.  Always ask for help to lift heavy or awkward objects. This information is not intended to replace advice given to you by your health care provider. Make sure you discuss any questions you have with your health care provider. Document Released: 08/17/2005 Document Revised: 04/23/2016 Document Reviewed: 05/29/2015 Elsevier Interactive Patient Education  2017 [employer].

## 2016-09-22 NOTE — Progress Notes (Signed)
Pre visit review using our clinic review tool, if applicable. No additional management support is needed unless otherwise documented below in the visit note. 

## 2016-09-22 NOTE — Progress Notes (Signed)
Subjective:    Patient ID: Carolyn Chambers, female    DOB: December 02, 1980, 36 y.o.   MRN: [id number]  CC: Carolyn Chambers is a 36 y.o. female who presents today for an acute visit.    HPI: CC: left great toe itching for couple of weeks, unchanged. Tried OTC neosporin, hydrocortizone, itch cream, spray for athlete's foot. Started out as blister from heels.   No one else in family is complaining of [place]   No fever, chills.   Also complains of low back pain for couple of months, unchanged. Pain when bending over. Describes shooting pain down right back side of leg. No numbness tingling. Describes ache, tight across low back. Felt muscles shaking. No injury, dysuria, flank pain.  Tried heat , iuprofen with no relief. No le weakness. No h/o cancer.       HISTORY:  Past Medical History:  Diagnosis Date  . Dysmenorrhea   . [id number])    Past Surgical History:  Procedure Laterality Date  . TUBAL LIGATION  2005   Family History  Problem Relation Age of Onset  . Hypertension Father   . ADD / ADHD Brother   . Emphysema Maternal Grandmother   . Cancer Maternal Grandfather     colon  . Alcohol abuse Maternal Grandfather   . Liver cancer Maternal Grandfather   . Heart attack Paternal Grandfather   . Diabetes Paternal Grandfather   . Hypertension Paternal Grandfather     Allergies: Patient has no known allergies. Current Outpatient Prescriptions on File Prior to Visit  Medication Sig Dispense Refill  . butalbital-acetaminophen-caffeine (FIORICET) 50-325-40 MG tablet Take 1-2 tablets by mouth every 6 (six) hours as needed for headache. Do not exceed 6 pills in 24 hours. 20 tablet 0  . cetirizine (ZYRTEC) 10 MG tablet Take 10 mg by mouth daily.     No current facility-administered medications on file prior to visit.     Social History  Substance Use Topics  . Smoking status: Former Smoker    Packs/day: 1.00  . Smokeless tobacco: Never Used  . Alcohol use 0.6 oz/week    1 Shots of  liquor per week     Comment: occasionally    Review of Systems  Constitutional: Negative for chills and fever.  Respiratory: Negative for cough.   Cardiovascular: Negative for chest pain and palpitations.  Gastrointestinal: Negative for nausea and vomiting.  Musculoskeletal: Positive for back pain. Negative for gait problem and joint swelling.  Skin: Positive for [place].      Objective:    BP (!) 100/58   Pulse 77   Temp 98.5 F (36.9 C) (Oral)   Ht 5\' 1"  (1.549 m)   Wt 133 lb 6.4 oz (60.5 kg)   LMP 09/04/2016 (Exact Date)   SpO2 98%   BMI 25.21 kg/m    Physical Exam  Constitutional: She appears well-developed and well-nourished.  Eyes: Conjunctivae are normal.  Cardiovascular: Normal rate, regular rhythm, normal heart sounds and normal pulses.   Pulmonary/Chest: Effort normal and breath sounds normal. She has no wheezes. She has no rhonchi. She has no rales.  Musculoskeletal:       Lumbar back: She exhibits normal range of motion, no tenderness, no bony tenderness, no swelling, no edema, no pain and no spasm.  Full range of motion with flexion, tension, lateral side bends. No bony tenderness. No pain, numbness, tingling elicited with single leg raise bilaterally.   Neurological: She is alert. She has normal strength. No sensory deficit.  Reflex Scores:      Patellar reflexes are 2+ on the right side and 2+ on the left side. Sensation and strength intact bilateral lower extremities.  Skin: Skin is warm and dry. Scroggins noted.  Erythematous localized Libman between 3rd and 4th metatarpal of left foot.   No discharge or increased warmth  Psychiatric: She has a normal mood and affect. Her speech is normal and behavior is normal. Thought content normal.  Vitals reviewed.      Assessment & Plan:   Problem List Items Addressed This Visit      Musculoskeletal and Integument   [place] and nonspecific skin eruption    Symptoms consistent with candida. Trial of antifungal.        Relevant Medications   clotrimazole (LOTRIMIN) 1 % cream     Other   Acute bilateral low back pain - Primary    Symptoms consistent with spasm, sprain. Advised PRN flexeril , avoiding exacerbating movements, heat, and exercise. Will consider prednisone and imaging if persists.         Relevant Medications   cyclobenzaprine (FLEXERIL) 10 MG tablet        I am having Carolyn Chambers start on clotrimazole and cyclobenzaprine. I am also having her maintain her cetirizine and butalbital-acetaminophen-caffeine.   Meds ordered this encounter  Medications  . clotrimazole (LOTRIMIN) 1 % cream    Sig: Apply 1 application topically 2 (two) times daily.    Dispense:  30 g    Refill:  1    Order Specific Question:   Supervising Provider    Answer:   Derrel Nip, TERESA L [[id number]]  . cyclobenzaprine (FLEXERIL) 10 MG tablet    Sig: Take 1 tablet (10 mg total) by mouth at bedtime as needed for muscle spasms.    Dispense:  14 tablet    Refill:  0    Order Specific Question:   Supervising Provider    Answer:   Crecencio Mc [[id number]]    Return precautions given.   Risks, benefits, and alternatives of the medications and treatment plan prescribed today were discussed, and patient expressed understanding.   Education regarding symptom management and diagnosis given to patient on AVS.  Continue to follow with Coral Spikes, DO for routine health maintenance.   Roma Reasoner and I agreed with plan.   Mable Paris, FNP

## 2016-10-05 ENCOUNTER — Ambulatory Visit (INDEPENDENT_AMBULATORY_CARE_PROVIDER_SITE_OTHER): Payer: Managed Care, Other (non HMO) | Admitting: Family Medicine

## 2016-10-05 ENCOUNTER — Encounter: Payer: Self-pay | Admitting: Family Medicine

## 2016-10-05 VITALS — BP 111/80 | HR 102 | Temp 98.1°F | Wt 134.4 lb

## 2016-10-05 DIAGNOSIS — B349 Viral infection, unspecified: Secondary | ICD-10-CM | POA: Diagnosis not present

## 2016-10-05 LAB — POCT RAPID STREP A (OFFICE): Rapid Strep A Screen: NEGATIVE

## 2016-10-05 MED ORDER — HYDROCOD POLST-CPM POLST ER 10-8 MG/5ML PO SUER: 5.0000 mL | Freq: Two times a day (BID) | ORAL | 0 refills | Status: DC | PRN

## 2016-10-05 NOTE — Patient Instructions (Signed)
This is viral.  OTC Ibuprofen, tylenol as needed.  Tussionex for cough.  Take care  Dr. Lacinda Axon

## 2016-10-05 NOTE — Progress Notes (Signed)
Pre visit review using our clinic review tool, if applicable. No additional management support is needed unless otherwise documented below in the visit note. 

## 2016-10-05 NOTE — Progress Notes (Signed)
   Subjective:  Patient ID: Carolyn Chambers, female    DOB: May 20, 1981  Age: 36 y.o. MRN: [id number]  CC: Sore throat, cough  HPI:  36 year old female presents with the above complaints.   Patient reports that she's been sick since Friday. She initially had nausea vomiting, diarrhea. This quickly resolved. She subsequently developed sore throat and cough. She's had flu exposure at work. No associated fever. She's had some body aches intermittently. She reports associated shortness breath with exertion. No known exacerbating or relieving factors. No other associated symptoms. No other complaints or concerns at this time.  Social Hx   Social History   Social History  . Marital status: Single    Spouse name: N/A  . Number of children: N/A  . Years of education: N/A   Social History Main Topics  . Smoking status: Former Smoker    Packs/day: 1.00  . Smokeless tobacco: Never Used  . Alcohol use 0.6 oz/week    1 Shots of liquor per week     Comment: occasionally  . Drug use: No  . Sexual activity: Yes    Birth control/ protection: Surgical   Other Topics Concern  . None   Social History Narrative  . None   Review of Systems  Constitutional: Negative for fever.  HENT: Positive for sore throat.   Respiratory: Positive for cough and shortness of breath.    Objective:  BP 111/80   Pulse (!) 102   Temp 98.1 F (36.7 C) (Oral)   Wt 134 lb 6.4 oz (61 kg)   SpO2 99%   BMI 25.39 kg/m   BP/Weight 10/05/2016 0000000 [id number]  Systolic BP 99991111 123XX123 A999333  Diastolic BP 80 58 60  Wt. (Lbs) 134.4 133.4 121  BMI 25.39 25.21 22.86   Physical Exam  Constitutional: She is oriented to person, place, and time. She appears well-developed. No distress.  HENT:  Mouth/Throat: Oropharynx is clear and moist.  Cardiovascular: Regular rhythm.   Tachycardic.  Pulmonary/Chest: Effort normal and breath sounds normal.  Neurological: She is alert and oriented to person, place, and time.  Psychiatric:  She has a normal mood and affect.  Vitals reviewed.  Lab Results  Component Value Date   WBC 8.4 07/12/2015   HGB 14.0 07/12/2015   HCT 42.7 07/12/2015   PLT 266.0 07/12/2015   GLUCOSE 95 07/12/2015   CHOL 167 07/12/2015   TRIG 56.0 07/12/2015   HDL 46.40 07/12/2015   LDLCALC 109 (H) 07/12/2015   ALT 13 07/12/2015   AST 18 07/12/2015   NA 138 07/12/2015   K 4.2 07/12/2015   CL 104 07/12/2015   CREATININE 0.72 07/12/2015   BUN 9 07/12/2015   CO2 28 07/12/2015   TSH 1.48 07/12/2015   Assessment & Plan:   Problem List Items Addressed This Visit    Viral illness - Primary    New problem. Acute uncomplicated illness. Rapid strep negative. Advised supportive care. Tussionex for cough.      Relevant Medications   chlorpheniramine-HYDROcodone (TUSSIONEX PENNKINETIC ER) 10-8 MG/5ML SUER   Other Relevant Orders   POCT rapid strep A (Completed)     Follow-up: PRN  [place]

## 2016-10-05 NOTE — Assessment & Plan Note (Signed)
New problem. Acute uncomplicated illness. Rapid strep negative. Advised supportive care. Tussionex for cough.

## 2017-01-05 ENCOUNTER — Encounter: Payer: Self-pay | Admitting: Physician Assistant

## 2017-01-05 ENCOUNTER — Ambulatory Visit (INDEPENDENT_AMBULATORY_CARE_PROVIDER_SITE_OTHER): Payer: Managed Care, Other (non HMO) | Admitting: Physician Assistant

## 2017-01-05 ENCOUNTER — Ambulatory Visit: Payer: Managed Care, Other (non HMO) | Admitting: Sports Medicine

## 2017-01-05 ENCOUNTER — Telehealth: Payer: Self-pay | Admitting: Physician Assistant

## 2017-01-05 VITALS — BP 112/72 | HR 104 | Temp 99.2°F | Ht 62.0 in | Wt 138.0 lb

## 2017-01-05 DIAGNOSIS — J0110 Acute frontal sinusitis, unspecified: Secondary | ICD-10-CM | POA: Diagnosis not present

## 2017-01-05 DIAGNOSIS — J039 Acute tonsillitis, unspecified: Secondary | ICD-10-CM | POA: Diagnosis not present

## 2017-01-05 MED ORDER — AMOXICILLIN-POT CLAVULANATE 875-125 MG PO TABS: 1.0000 | ORAL_TABLET | Freq: Two times a day (BID) | ORAL | 0 refills | Status: AC

## 2017-01-05 NOTE — Patient Instructions (Signed)
It was great meeting you today!  Start the oral antibiotic. Stay hydrated and push fluids. Use flonase daily. Use ibuprofen as needed for throat pain or fevers.  Rest. Consider humidifier in bedroom. Call or return to clinic if symptoms are not improving, especially if worsening fever or other symptoms.   Upper Respiratory Infection, Adult Most upper respiratory infections (URIs) are a viral infection of the air passages leading to the lungs. A URI affects the nose, throat, and upper air passages. The most common type of URI is nasopharyngitis and is typically referred to as "the common cold." URIs run their course and usually go away on their own. Most of the time, a URI does not require medical attention, but sometimes a bacterial infection in the upper airways can follow a viral infection. This is called a secondary infection. Sinus and middle ear infections are common types of secondary upper respiratory infections. Bacterial pneumonia can also complicate a URI. A URI can worsen asthma and chronic obstructive pulmonary disease (COPD). Sometimes, these complications can require emergency medical care and may be life threatening. What are the causes? Almost all URIs are caused by viruses. A virus is a type of germ and can spread from one person to another. What increases the risk? You may be at risk for a URI if:  You smoke.  You have chronic heart or lung disease.  You have a weakened defense (immune) system.  You are very young or very old.  You have nasal allergies or asthma.  You work in crowded or poorly ventilated areas.  You work in health care facilities or schools. What are the signs or symptoms? Symptoms typically develop 2-3 days after you come in contact with a cold virus. Most viral URIs last 7-10 days. However, viral URIs from the influenza virus (flu virus) can last 14-18 days and are typically more severe. Symptoms may include:  Runny or stuffy (congested)  nose.  Sneezing.  Cough.  Sore throat.  Headache.  Fatigue.  Fever.  Loss of appetite.  Pain in your forehead, behind your eyes, and over your cheekbones (sinus pain).  Muscle aches. How is this diagnosed? Your health care provider may diagnose a URI by:  Physical exam.  Tests to check that your symptoms are not due to another condition such as:  Strep throat.  Sinusitis.  Pneumonia.  Asthma. How is this treated? A URI goes away on its own with time. It cannot be cured with medicines, but medicines may be prescribed or recommended to relieve symptoms. Medicines may help:  Reduce your fever.  Reduce your cough.  Relieve nasal congestion. Follow these instructions at home:  Take medicines only as directed by your health care provider.  Gargle warm saltwater or take cough drops to comfort your throat as directed by your health care provider.  Use a warm mist humidifier or inhale steam from a shower to increase air moisture. This may make it easier to breathe.  Drink enough fluid to keep your urine clear or pale yellow.  Eat soups and other clear broths and maintain good nutrition.  Rest as needed.  Return to work when your temperature has returned to normal or as your health care provider advises. You may need to stay home longer to avoid infecting others. You can also use a face mask and careful hand washing to prevent spread of the virus.  Increase the usage of your inhaler if you have asthma.  Do not use any tobacco products, including  cigarettes, chewing tobacco, or electronic cigarettes. If you need help quitting, ask your health care provider. How is this prevented? The best way to protect yourself from getting a cold is to practice good hygiene.  Avoid oral or hand contact with people with cold symptoms.  Wash your hands often if contact occurs. There is no clear evidence that vitamin C, vitamin E, echinacea, or exercise reduces the chance of  developing a cold. However, it is always recommended to get plenty of rest, exercise, and practice good nutrition. Contact a health care provider if:  You are getting worse rather than better.  Your symptoms are not controlled by medicine.  You have chills.  You have worsening shortness of breath.  You have brown or red mucus.  You have yellow or brown nasal discharge.  You have pain in your face, especially when you bend forward.  You have a fever.  You have swollen neck glands.  You have pain while swallowing.  You have white areas in the back of your throat. Get help right away if:  You have severe or persistent:  Headache.  Ear pain.  Sinus pain.  Chest pain.  You have chronic lung disease and any of the following:  Wheezing.  Prolonged cough.  Coughing up blood.  A change in your usual mucus.  You have a stiff neck.  You have changes in your:  Vision.  Hearing.  Thinking.  Mood. This information is not intended to replace advice given to you by your health care provider. Make sure you discuss any questions you have with your health care provider. Document Released: 02/10/2001 Document Revised: 04/19/2016 Document Reviewed: 11/22/2013 Elsevier Interactive Patient Education  2017 [employer].

## 2017-01-05 NOTE — Telephone Encounter (Signed)
Sent!

## 2017-01-05 NOTE — Progress Notes (Signed)
Carolyn Chambers is a 36 y.o. female here for a new problem.  SCRIBE Benson acting as Education administrator for [employer].   History of Present Illness:   Chief Complaint  Patient presents with  . Headache    X2days   . Nasal Congestion    X2days   . Sore Throat    X2days     HPI  Patient reports chief complaint of URI symptoms. Her symptoms started 2-3 days ago. Symptoms include HA, nasal congestion with clear or yellow mucus, dry cough, and sore throat. She started taking Zyrtec daily, taking Sudafed PE since onset of symptoms. All symptoms are getting worse with time. Husband is also sick with similar symptoms, son was sick with similar symptoms last week. Appetite is variable, ate well yesterday but has poor appetite today. Drinking water. Temperature at home was 99.7 yesterday. Not a smoker but exposed to second hand smoke at home. Has used nasal sprays in the past. No diarrhea.  PMHx, SurgHx, SocialHx, Medications, and Allergies were reviewed in the Visit Navigator and updated as appropriate.  Current Medications:   Current Outpatient Prescriptions:  .  cetirizine (ZYRTEC) 10 MG tablet, Take 10 mg by mouth daily., Disp: , Rfl:  .  cyclobenzaprine (FLEXERIL) 10 MG tablet, Take 1 tablet (10 mg total) by mouth at bedtime as needed for muscle spasms., Disp: 14 tablet, Rfl: 0 .  amoxicillin-clavulanate (AUGMENTIN) 875-125 MG tablet, Take 1 tablet by mouth 2 (two) times daily., Disp: 20 tablet, Rfl: 0 .  butalbital-acetaminophen-caffeine (FIORICET) 50-325-40 MG tablet, Take 1-2 tablets by mouth every 6 (six) hours as needed for headache. Do not exceed 6 pills in 24 hours. (Patient not taking: Reported on 01/05/2017), Disp: 20 tablet, Rfl: 0 .  chlorpheniramine-HYDROcodone (TUSSIONEX PENNKINETIC ER) 10-8 MG/5ML SUER, Take 5 mLs by mouth every 12 (twelve) hours as needed. (Patient not taking: Reported on 01/05/2017), Disp: 115 mL, Rfl: 0 .  clotrimazole (LOTRIMIN) 1 % cream, Apply 1  application topically 2 (two) times daily. (Patient not taking: Reported on 01/05/2017), Disp: 30 g, Rfl: 1   Review of Systems:   Review of Systems  Constitutional: Positive for chills, fever and malaise/fatigue. Negative for diaphoresis and weight loss.  HENT: Positive for congestion, ear pain, sinus pain and sore throat. Negative for ear discharge, hearing loss, nosebleeds and tinnitus.   Eyes: Positive for discharge. Negative for blurred vision, double vision, photophobia, pain and redness.  Respiratory: Positive for cough. Negative for hemoptysis, sputum production, shortness of breath, wheezing and stridor.   Cardiovascular: Positive for chest pain (discomfort with deep inspiration). Negative for palpitations, orthopnea, claudication, leg swelling and PND.  Neurological: Positive for weakness and headaches. Negative for dizziness, tingling, tremors, sensory change, speech change, focal weakness, seizures and loss of consciousness.    Vitals:   Vitals:   01/05/17 1109  BP: 112/72  Pulse: (!) 104  Temp: 99.2 F (37.3 C)  TempSrc: Oral  SpO2: 97%  Weight: 138 lb (62.6 kg)  Height: 5\' 2"  (1.575 m)     Body mass index is 25.24 kg/m.  Physical Exam:   Physical Exam  Constitutional: She appears well-developed. She is cooperative.  Non-toxic appearance. She does not have a sickly appearance. She does not appear ill. No distress.  Appears fatigued  HENT:  Head: Normocephalic and atraumatic.  Right Ear: Tympanic membrane, external ear and ear canal normal. Tympanic membrane is not erythematous, not retracted and not bulging.  Left Ear: Tympanic membrane, external ear  and ear canal normal. Tympanic membrane is not erythematous, not retracted and not bulging.  Nose: Nose normal. Right sinus exhibits no maxillary sinus tenderness and no frontal sinus tenderness. Left sinus exhibits no maxillary sinus tenderness and no frontal sinus tenderness.  Mouth/Throat: Uvula is midline and mucous  membranes are normal. No trismus in the jaw. Posterior oropharyngeal edema and posterior oropharyngeal erythema present. Tonsils are 2+ on the right. Tonsils are 1+ on the left. Tonsillar exudate.  Eyes: Conjunctivae and lids are normal.  Neck: Trachea normal.  Cardiovascular: Normal rate, regular rhythm, S1 normal, S2 normal and normal heart sounds.   Pulmonary/Chest: Effort normal and breath sounds normal. She has no decreased breath sounds. She has no wheezes. She has no rhonchi. She has no rales.  Lymphadenopathy:    She has no cervical adenopathy.  Neurological: She is alert.  Skin: Skin is warm, dry and intact.  Psychiatric: She has a normal mood and affect. Her speech is normal and behavior is normal.  Nursing note and vitals reviewed.     Assessment and Plan:    Carolyn Chambers was seen today for headache, nasal congestion and sore throat.  Diagnoses and all orders for this visit:  Exudative tonsillitis  Acute non-recurrent frontal sinusitis  Other orders -     amoxicillin-clavulanate (AUGMENTIN) 875-125 MG tablet; Take 1 tablet by mouth 2 (two) times daily.   Will start Augmentin per orders to treat tonsillitis and early sinus infection. Discussed use of flonase and prn ibuprofen or tylenol for throat pain. Reviewed red flags, follow-up if symptoms worsen or do not improve with treatment. Patient agreeable to plan.   . Reviewed expectations re: course of current medical issues. . Discussed self-management of symptoms. . Outlined signs and symptoms indicating need for more acute intervention. . Patient verbalized understanding and all questions were answered. . See orders for this visit as documented in the electronic medical record. . Patient received an After-Visit Summary.  CMA or LPN served as scribe during this visit. History, Physical, and Plan performed by medical provider. Documentation and orders reviewed and attested to.  Inda Coke, PA-C

## 2017-01-05 NOTE — Telephone Encounter (Signed)
Patient states she was supposed to get letter for work. Patient stated she would set her mychart up to retrieve the letter.

## 2017-09-13 ENCOUNTER — Other Ambulatory Visit: Payer: Self-pay

## 2017-09-13 ENCOUNTER — Ambulatory Visit (HOSPITAL_COMMUNITY)
Admission: EM | Admit: 2017-09-13 | Discharge: 2017-09-13 | Disposition: A | Discharge status: Home / Self Care | Payer: 59 | Attending: Urgent Care | Admitting: Urgent Care

## 2017-09-13 ENCOUNTER — Encounter (HOSPITAL_COMMUNITY): Payer: Self-pay | Admitting: Emergency Medicine

## 2017-09-13 ENCOUNTER — Ambulatory Visit (INDEPENDENT_AMBULATORY_CARE_PROVIDER_SITE_OTHER): Payer: 59

## 2017-09-13 DIAGNOSIS — R05 Cough: Secondary | ICD-10-CM

## 2017-09-13 DIAGNOSIS — B349 Viral infection, unspecified: Secondary | ICD-10-CM | POA: Diagnosis not present

## 2017-09-13 DIAGNOSIS — R0789 Other chest pain: Secondary | ICD-10-CM | POA: Diagnosis not present

## 2017-09-13 DIAGNOSIS — R059 Cough, unspecified: Secondary | ICD-10-CM

## 2017-09-13 DIAGNOSIS — R079 Chest pain, unspecified: Secondary | ICD-10-CM | POA: Diagnosis not present

## 2017-09-13 LAB — POCT H PYLORI SCREEN: H. PYLORI SCREEN, POC: NEGATIVE

## 2017-09-13 MED ORDER — BENZONATATE 100 MG PO CAPS: 100.0000 mg | ORAL_CAPSULE | Freq: Three times a day (TID) | ORAL | 0 refills | Status: DC | PRN

## 2017-09-13 MED ORDER — HYDROCOD POLST-CPM POLST ER 10-8 MG/5ML PO SUER: 5.0000 mL | Freq: Every evening | ORAL | 0 refills | Status: DC | PRN

## 2017-09-13 MED ORDER — MELOXICAM 7.5 MG PO TABS: 7.5000 mg | ORAL_TABLET | Freq: Every day | ORAL | 0 refills | Status: DC

## 2017-09-13 NOTE — ED Triage Notes (Signed)
Pt c/o coughing with chest congestion, headache, and "seeing spots".

## 2017-09-13 NOTE — ED Provider Notes (Signed)
  MRN: [id number] DOB: Jul 09, 1981  Subjective:   Carolyn Chambers is a 37 y.o. female presenting for 2 week history of mid sternal intermittent sharp chest pain that elicits shob. Pain occurs for ~30 minutes, worse with laying down, taking deep breath. Has also had dry cough for the past week. Has tried ibuprofen with minimal relief. Denies fever, sore throat, n/v, trauma. Denies smoking cigarettes. Denies history of asthma.   Carolyn Chambers takes Zyrtec and has No Known Allergies.  Carolyn Chambers  has a past medical history of Dysmenorrhea and Headache(784.0). Also  has a past surgical history that includes Tubal ligation (2005). Her family history includes ADD / ADHD in her brother; Alcohol abuse in her maternal grandfather; Cancer in her maternal grandfather; Diabetes in her paternal grandfather; Emphysema in her maternal grandmother; Heart attack in her paternal grandfather; Hypertension in her father and paternal grandfather; Liver cancer in her maternal grandfather.   Objective:   Vitals: BP 118/69   Pulse 92   Temp 98.9 F (37.2 C)   Resp 18   SpO2 100%   Physical Exam  Constitutional: She is oriented to person, place, and time. She appears well-developed and well-nourished.  HENT:  Mouth/Throat: Oropharynx is clear and moist.  Cardiovascular: Normal rate, regular rhythm and intact distal pulses. Exam reveals no gallop and no friction rub.  No murmur heard. Pulmonary/Chest: No respiratory distress. She has no wheezes. She has no rales. She exhibits no tenderness.  Neurological: She is alert and oriented to person, place, and time.  Skin: Skin is warm and dry.  Psychiatric: She has a normal mood and affect.   Results for orders placed or performed during the hospital encounter of 09/13/17 (from the past 24 hour(s))  H.pylori screen, POC     Status: None   Collection Time: 09/13/17  3:34 PM  Result Value Ref Range   H. PYLORI SCREEN, POC NEGATIVE NEGATIVE   Dg Chest 2 View  Result Date:  09/13/2017 CLINICAL DATA:  Cough.  Atypical chest pain. EXAM: CHEST  2 VIEW COMPARISON:  07/13/2011 FINDINGS: The heart size and mediastinal contours are within normal limits. Both lungs are clear. The visualized skeletal structures are unremarkable. IMPRESSION: Normal exam. Electronically Signed   By: Lorriane Shire M.D.   On: 09/13/2017 15:22    Assessment and Plan :   Chest wall pain  Viral illness - Plan: chlorpheniramine-HYDROcodone (TUSSIONEX PENNKINETIC ER) 10-8 MG/5ML SUER  Cough  Will manage as costochondritis of unknown etiology. Start meloxicam, recommended light activities. Cough suppression recommended as well. Return-to-clinic precautions discussed, patient verbalized understanding.    Jaynee Eagles, [place] 09/13/17 1552

## 2018-10-03 ENCOUNTER — Encounter: Payer: Self-pay | Admitting: Family

## 2018-10-03 ENCOUNTER — Ambulatory Visit: Payer: 59 | Admitting: Family

## 2018-10-03 ENCOUNTER — Other Ambulatory Visit: Payer: Self-pay

## 2018-10-03 ENCOUNTER — Other Ambulatory Visit: Payer: Self-pay | Admitting: Family

## 2018-10-03 VITALS — BP 102/70 | HR 97 | Temp 98.0°F | Wt 146.8 lb

## 2018-10-03 DIAGNOSIS — R1013 Epigastric pain: Secondary | ICD-10-CM

## 2018-10-03 LAB — COMPREHENSIVE METABOLIC PANEL
ALT: 15 U/L (ref 0–35)
AST: 16 U/L (ref 0–37)
Albumin: 4.3 g/dL (ref 3.5–5.2)
Alkaline Phosphatase: 66 U/L (ref 39–117)
BUN: 10 mg/dL (ref 6–23)
CHLORIDE: 106 meq/L (ref 96–112)
CO2: 27 meq/L (ref 19–32)
Calcium: 8.9 mg/dL (ref 8.4–10.5)
Creatinine, Ser: 0.66 mg/dL (ref 0.40–1.20)
GFR: 100.45 mL/min (ref >60.00)
Glucose, Bld: 112 mg/dL — ABNORMAL HIGH (ref 70–99)
Potassium: 3.9 mEq/L (ref 3.5–5.1)
SODIUM: 139 meq/L (ref 135–145)
Total Bilirubin: 0.3 mg/dL (ref 0.2–1.2)
Total Protein: 7.1 g/dL (ref 6.0–8.3)

## 2018-10-03 LAB — CBC WITH DIFFERENTIAL/PLATELET
BASOS PCT: 0.2 % (ref 0.0–3.0)
Basophils Absolute: 0 10*3/uL (ref 0.0–0.1)
EOS ABS: 0.1 10*3/uL (ref 0.0–0.7)
EOS PCT: 0.9 % (ref 0.0–5.0)
HCT: 42.2 % (ref 36.0–46.0)
Hemoglobin: 14.1 g/dL (ref 12.0–15.0)
Lymphocytes Relative: 15.4 % (ref 12.0–46.0)
Lymphs Abs: 1.8 10*3/uL (ref 0.7–4.0)
MCHC: 33.5 g/dL (ref 30.0–36.0)
MCV: 90 fl (ref 78.0–100.0)
MONO ABS: 0.7 10*3/uL (ref 0.1–1.0)
Monocytes Relative: 6.2 % (ref 3.0–12.0)
Neutro Abs: 8.9 10*3/uL — ABNORMAL HIGH (ref 1.4–7.7)
Neutrophils Relative %: 77.3 % — ABNORMAL HIGH (ref 43.0–77.0)
PLATELETS: 239 10*3/uL (ref 150.0–400.0)
RBC: 4.69 Mil/uL (ref 3.87–5.11)
RDW: 13.1 % (ref 11.5–15.5)
WBC: 11.5 10*3/uL — ABNORMAL HIGH (ref 4.0–10.5)

## 2018-10-03 LAB — H. PYLORI ANTIBODY, IGG: H Pylori IgG: NEGATIVE

## 2018-10-03 LAB — LIPASE: LIPASE: 17 U/L (ref 11.0–59.0)

## 2018-10-03 MED ORDER — OMEPRAZOLE 20 MG PO CPDR
20.0000 mg | DELAYED_RELEASE_CAPSULE | Freq: Every day | ORAL | 1 refills | Status: DC
Start: 2018-10-03 — End: 2018-11-28

## 2018-10-03 MED ORDER — OMEPRAZOLE 20 MG PO CPDR: 20.0000 mg | DELAYED_RELEASE_CAPSULE | Freq: Every day | ORAL | 1 refills | Status: DC

## 2018-10-03 NOTE — Patient Instructions (Addendum)
Suspect atypical presentation of reflux. Trial of prilosec.   Stay very very vigilant - and let me know of ANY new or worsening symptoms.   Please make physical with me.    Gastroesophageal Reflux Disease, Adult Gastroesophageal reflux (GER) happens when acid from the stomach flows up into the tube that connects the mouth and the stomach (esophagus). Normally, food travels down the esophagus and stays in the stomach to be digested. With GER, food and stomach acid sometimes move back up into the esophagus. You may have a disease called gastroesophageal reflux disease (GERD) if the reflux:  Happens often.  Causes frequent or very bad symptoms.  Causes problems such as damage to the esophagus. When this happens, the esophagus becomes sore and swollen (inflamed). Over time, GERD can make small holes (ulcers) in the lining of the esophagus. What are the causes? This condition is caused by a problem with the muscle between the esophagus and the stomach. When this muscle is weak or not normal, it does not close properly to keep food and acid from coming back up from the stomach. The muscle can be weak because of:  Tobacco use.  Pregnancy.  Having a certain type of hernia (hiatal hernia).  Alcohol use.  Certain foods and drinks, such as coffee, chocolate, onions, and peppermint. What increases the risk? You are more likely to develop this condition if you:  Are overweight.  Have a disease that affects your connective tissue.  Use NSAID medicines. What are the signs or symptoms? Symptoms of this condition include:  Heartburn.  Difficult or painful swallowing.  The feeling of having a lump in the throat.  A bitter taste in the mouth.  Bad breath.  Having a lot of saliva.  Having an upset or bloated stomach.  Belching.  Chest pain. Different conditions can cause chest pain. Make sure you see your doctor if you have chest pain.  Shortness of breath or noisy breathing  (wheezing).  Ongoing (chronic) cough or a cough at night.  Wearing away of the surface of teeth (tooth enamel).  Weight loss. How is this treated? Treatment will depend on how bad your symptoms are. Your doctor may suggest:  Changes to your diet.  Medicine.  Surgery. Follow these instructions at home: Eating and drinking   Follow a diet as told by your doctor. You may need to avoid foods and drinks such as: ? Coffee and tea (with or without caffeine). ? Drinks that contain alcohol. ? Energy drinks and sports drinks. ? Bubbly (carbonated) drinks or sodas. ? Chocolate and cocoa. ? Peppermint and mint flavorings. ? Garlic and onions. ? Horseradish. ? Spicy and acidic foods. These include peppers, chili powder, curry powder, vinegar, hot sauces, and BBQ sauce. ? Citrus fruit juices and citrus fruits, such as oranges, lemons, and limes. ? Tomato-based foods. These include red sauce, chili, salsa, and pizza with red sauce. ? Fried and fatty foods. These include donuts, french fries, potato chips, and high-fat dressings. ? High-fat meats. These include hot dogs, rib eye steak, sausage, ham, and bacon. ? High-fat dairy items, such as whole milk, butter, and cream cheese.  Eat small meals often. Avoid eating large meals.  Avoid drinking large amounts of liquid with your meals.  Avoid eating meals during the 2-3 hours before bedtime.  Avoid lying down right after you eat.  Do not exercise right after you eat. Lifestyle   Do not use any products that contain nicotine or tobacco. These include cigarettes,  e-cigarettes, and chewing tobacco. If you need help quitting, ask your doctor.  Try to lower your stress. If you need help doing this, ask your doctor.  If you are overweight, lose an amount of weight that is healthy for you. Ask your doctor about a safe weight loss goal. General instructions  Pay attention to any changes in your symptoms.  Take over-the-counter and  prescription medicines only as told by your doctor. Do not take aspirin, ibuprofen, or other NSAIDs unless your doctor says it is okay.  Wear loose clothes. Do not wear anything tight around your waist.  Raise (elevate) the head of your bed about 6 inches (15 cm).  Avoid bending over if this makes your symptoms worse.  Keep all follow-up visits as told by your doctor. This is important. Contact a doctor if:  You have new symptoms.  You lose weight and you do not know why.  You have trouble swallowing or it hurts to swallow.  You have wheezing or a cough that keeps happening.  Your symptoms do not get better with treatment.  You have a hoarse voice. Get help right away if:  You have pain in your arms, neck, jaw, teeth, or back.  You feel sweaty, dizzy, or light-headed.  You have chest pain or shortness of breath.  You throw up (vomit) and your throw-up looks like blood or coffee grounds.  You pass out (faint).  Your poop (stool) is bloody or black.  You cannot swallow, drink, or eat. Summary  If a person has gastroesophageal reflux disease (GERD), food and stomach acid move back up into the esophagus and cause symptoms or problems such as damage to the esophagus.  Treatment will depend on how bad your symptoms are.  Follow a diet as told by your doctor.  Take all medicines only as told by your doctor. This information is not intended to replace advice given to you by your health care provider. Make sure you discuss any questions you have with your health care provider. Document Released: 02/03/2008 Document Revised: 02/23/2018 Document Reviewed: 02/23/2018 Elsevier Interactive Patient Education  2019 [employer].

## 2018-10-03 NOTE — Progress Notes (Signed)
Subjective:    Patient ID: Carolyn Chambers, female    DOB: [birth year], 38 y.o.   MRN: [id number]  Carolyn Chambers is a 38 y.o. female who presents today for an acute visit.    HPI: CC: nausea x one week, unchanged.   Nausea worse with activity, walking. Nausea is not affected by certain food.  Passing gas. No vomiting, fever, dyuria. No increase burp.  No changes to vaginal discharge. Had epigastric burning last night; h/o GERD since a child. Slept upright to help with pain.  HA has been intermittent the past week, 'not consistent'. Not worse HA of life. No vision changes. H/o migraines.   Endorses 'stomach pain is upper middle' , stabbing , cramp.   H/o tubal ligation.   No NSAID use. Rare alcohol use.       HISTORY:  Past Medical History:  Diagnosis Date  . Dysmenorrhea   . SKAJGOTL(572.6)    Past Surgical History:  Procedure Laterality Date  . TUBAL LIGATION  2005   Family History  Problem Relation Age of Onset  . Hypertension Father   . ADD / ADHD Brother   . Emphysema Maternal Grandmother   . Cancer Maternal Grandfather        colon  . Alcohol abuse Maternal Grandfather   . Liver cancer Maternal Grandfather   . Heart attack Paternal Grandfather   . Diabetes Paternal Grandfather   . Hypertension Paternal Grandfather     Allergies: Patient has no known allergies. Current Outpatient Medications on File Prior to Visit  Medication Sig Dispense Refill  . cetirizine (ZYRTEC) 10 MG tablet Take 10 mg by mouth daily.    . cyclobenzaprine (FLEXERIL) 10 MG tablet Take 1 tablet (10 mg total) by mouth at bedtime as needed for muscle spasms. 14 tablet 0   No current facility-administered medications on file prior to visit.     Social History   Tobacco Use  . Smoking status: Former Smoker    Packs/day: 1.00  . Smokeless tobacco: Never Used  Substance Use Topics  . Alcohol use: Yes    Alcohol/week: 1.0 standard drinks    Types: 1 Shots of liquor per week    Comment:  occasionally  . Drug use: No    Review of Systems  Constitutional: Negative for chills and fever.  Eyes: Negative for visual disturbance.  Respiratory: Negative for cough.   Cardiovascular: Negative for chest pain and palpitations.  Gastrointestinal: Positive for abdominal pain and nausea. Negative for constipation, diarrhea and vomiting.  Neurological: Positive for headaches.      Objective:    BP 102/70 (BP Location: Left Arm, Patient Position: Sitting, Cuff Size: Large)   Pulse 97   Temp 98 F (36.7 C)   Wt 146 lb 12.8 oz (66.6 kg)   SpO2 97%   BMI 26.85 kg/m    Physical Exam Vitals signs reviewed.  Constitutional:      Appearance: She is well-developed.  Eyes:     Conjunctiva/sclera: Conjunctivae normal.  Cardiovascular:     Rate and Rhythm: Normal rate and regular rhythm.     Pulses: Normal pulses.     Heart sounds: Normal heart sounds.  Pulmonary:     Effort: Pulmonary effort is normal.     Breath sounds: Normal breath sounds. No wheezing, rhonchi or rales.  Abdominal:     General: Abdomen is flat. Bowel sounds are normal. There is no distension.     Tenderness: There is generalized abdominal tenderness. There  is no rebound. Negative signs include Murphy's sign.       Comments: Generalized tenderness. Not exquisite. No rebound.  Skin:    General: Skin is warm and dry.  Neurological:     Mental Status: She is alert.  Psychiatric:        Speech: Speech normal.        Behavior: Behavior normal.        Thought Content: Thought content normal.        Assessment & Plan:   Problem List Items Addressed This Visit      Other   Epigastric pain - Primary    Working diagnosis of acid reflux.  Patient is nontoxic in appearance.  She is afebrile.  She declines imaging at this time.  She feels comfortable starting Prilosec with very close vigilance.  She will let me know any new or worsening symptoms.  Close follow-up      Relevant Orders   H. pylori antibody,  IgG (Completed)   CBC with Differential/Platelet (Completed)   Comprehensive metabolic panel (Completed)   Lipase (Completed)        I have discontinued Annelisa Grubb's clotrimazole, meloxicam, benzonatate, and chlorpheniramine-HYDROcodone. I am also having her maintain her cetirizine and cyclobenzaprine.   Meds ordered this encounter  Medications  . DISCONTD: omeprazole (PRILOSEC) 20 MG capsule    Sig: Take 1 capsule (20 mg total) by mouth daily.    Dispense:  30 capsule    Refill:  1    Order Specific Question:   Supervising Provider    Answer:   Crecencio Mc [[id number]]    Return precautions given.   Risks, benefits, and alternatives of the medications and treatment plan prescribed today were discussed, and patient expressed understanding.   Education regarding symptom management and diagnosis given to patient on AVS.  Continue to follow with Burnard Hawthorne, FNP for routine health maintenance.   Carolyn Chambers and I agreed with plan.   Mable Paris, FNP

## 2018-10-04 ENCOUNTER — Other Ambulatory Visit (INDEPENDENT_AMBULATORY_CARE_PROVIDER_SITE_OTHER): Payer: 59

## 2018-10-04 ENCOUNTER — Encounter: Payer: Self-pay | Admitting: Family

## 2018-10-04 ENCOUNTER — Other Ambulatory Visit: Payer: Self-pay | Admitting: Radiology

## 2018-10-04 DIAGNOSIS — D72829 Elevated white blood cell count, unspecified: Secondary | ICD-10-CM | POA: Diagnosis not present

## 2018-10-04 LAB — CBC WITH DIFFERENTIAL/PLATELET
BASOS PCT: 0.3 % (ref 0.0–3.0)
Basophils Absolute: 0 10*3/uL (ref 0.0–0.1)
EOS ABS: 0.2 10*3/uL (ref 0.0–0.7)
Eosinophils Relative: 3 % (ref 0.0–5.0)
HEMATOCRIT: 41.5 % (ref 36.0–46.0)
Hemoglobin: 13.8 g/dL (ref 12.0–15.0)
LYMPHS ABS: 2.5 10*3/uL (ref 0.7–4.0)
Lymphocytes Relative: 32.8 % (ref 12.0–46.0)
MCHC: 33.3 g/dL (ref 30.0–36.0)
MCV: 90.2 fl (ref 78.0–100.0)
MONO ABS: 0.5 10*3/uL (ref 0.1–1.0)
Monocytes Relative: 6.8 % (ref 3.0–12.0)
NEUTROS PCT: 57.1 % (ref 43.0–77.0)
Neutro Abs: 4.4 10*3/uL (ref 1.4–7.7)
PLATELETS: 218 10*3/uL (ref 150.0–400.0)
RBC: 4.6 Mil/uL (ref 3.87–5.11)
RDW: 12.9 % (ref 11.5–15.5)
WBC: 7.6 10*3/uL (ref 4.0–10.5)

## 2018-10-05 ENCOUNTER — Encounter: Payer: Self-pay | Admitting: Family

## 2018-10-05 DIAGNOSIS — R1013 Epigastric pain: Secondary | ICD-10-CM | POA: Insufficient documentation

## 2018-10-05 NOTE — Assessment & Plan Note (Signed)
Working diagnosis of acid reflux.  Patient is nontoxic in appearance.  She is afebrile.  She declines imaging at this time.  She feels comfortable starting Prilosec with very close vigilance.  She will let me know any new or worsening symptoms.  Close follow-up

## 2018-10-07 NOTE — Telephone Encounter (Signed)
Spoke with pt, she advised that she was contacted yesterday and told that a note was at the front desk for her to pick up. I also routed note to her via [facility]. If she has any trouble accessing note via MyChart, she will pick the note up from the front desk. No further action required at this time.

## 2018-11-02 ENCOUNTER — Other Ambulatory Visit (HOSPITAL_COMMUNITY)
Admission: RE | Admit: 2018-11-02 | Discharge: 2018-11-02 | Disposition: A | Discharge status: Home / Self Care | Payer: 59 | Source: Ambulatory Visit | Attending: Family | Admitting: Family

## 2018-11-02 ENCOUNTER — Encounter: Payer: Self-pay | Admitting: Family

## 2018-11-02 ENCOUNTER — Ambulatory Visit (INDEPENDENT_AMBULATORY_CARE_PROVIDER_SITE_OTHER): Payer: 59 | Admitting: Family

## 2018-11-02 VITALS — BP 110/62 | HR 82 | Temp 98.7°F | Ht 61.75 in | Wt 149.2 lb

## 2018-11-02 DIAGNOSIS — Z0000 Encounter for general adult medical examination without abnormal findings: Secondary | ICD-10-CM | POA: Insufficient documentation

## 2018-11-02 DIAGNOSIS — N898 Other specified noninflammatory disorders of vagina: Secondary | ICD-10-CM

## 2018-11-02 DIAGNOSIS — D179 Benign lipomatous neoplasm, unspecified: Secondary | ICD-10-CM | POA: Diagnosis not present

## 2018-11-02 DIAGNOSIS — Z23 Encounter for immunization: Secondary | ICD-10-CM

## 2018-11-02 LAB — URINALYSIS, ROUTINE W REFLEX MICROSCOPIC
Bilirubin Urine: NEGATIVE
Hgb urine dipstick: NEGATIVE
Ketones, ur: NEGATIVE
Leukocytes,Ua: NEGATIVE
NITRITE: NEGATIVE
PH: 5.5 (ref 5.0–8.0)
RBC / HPF: NONE SEEN (ref 0–?)
TOTAL PROTEIN, URINE-UPE24: NEGATIVE
Urine Glucose: NEGATIVE
Urobilinogen, UA: 0.2 (ref 0.0–1.0)

## 2018-11-02 LAB — LIPID PANEL
CHOLESTEROL: 185 mg/dL (ref 0–200)
HDL: 42.5 mg/dL (ref >39.00)
LDL CALC: 109 mg/dL — AB (ref 0–99)
NonHDL: 142.13
TRIGLYCERIDES: 167 mg/dL — AB (ref 0.0–149.0)
Total CHOL/HDL Ratio: 4
VLDL: 33.4 mg/dL (ref 0.0–40.0)

## 2018-11-02 LAB — TSH: TSH: 1.53 u[IU]/mL (ref 0.35–4.50)

## 2018-11-02 LAB — HEMOGLOBIN A1C: Hgb A1c MFr Bld: 5.6 % (ref 4.6–6.5)

## 2018-11-02 LAB — VITAMIN D 25 HYDROXY (VIT D DEFICIENCY, FRACTURES): VITD: 19.45 ng/mL — ABNORMAL LOW (ref 30.00–100.00)

## 2018-11-02 NOTE — Assessment & Plan Note (Signed)
Suspected yeast infection.  Pending culture, urinary studies

## 2018-11-02 NOTE — Progress Notes (Signed)
Subjective:    Patient ID: Carolyn Chambers, female    DOB: 04-15-1981, 38 y.o.   MRN: [id number]  CC: Carolyn Chambers is a 38 y.o. female who presents today for physical exam.    HPI: Noticed 'lumps' under left arm, 1-2. Noticed the first one a year ago, and the second lump a couple of  Months ago. No recent URI. No fever, night sweats. Unchanged in size.   Not draining, painful.   When wipes noted thick, white discharge for past week. Some vaginal itching. No dysuria, flank pain, hematuria. Urinating more often. No concerns for STDs.   Regular menses.    Prilosec started at last visit. Colorectal Cancer Screening: no early family history or first degree relative. Breast Cancer Screening: No early family history Cervical Cancer Screening: due. Unable to see last pap. Per patient, normal. With Dr Ouida Sills, GYN.         Tetanus - utd         Labs: Screening labs today. Exercise: Gets regular exercise.  Alcohol use: Occasionally Smoking/tobacco use: Former smoker.  Regular dental exams: UTD Wears seat belt: Yes. Skin: dark spot on right side nose. No h/o skin cancer.      HISTORY:  Past Medical History:  Diagnosis Date  . Dysmenorrhea   . WEXHBZJI(967.8)     Past Surgical History:  Procedure Laterality Date  . TUBAL LIGATION  2005   Family History  Problem Relation Age of Onset  . Hypertension Father   . ADD / ADHD Brother   . Emphysema Maternal Grandmother   . Cancer Maternal Grandfather 59       colon  . Alcohol abuse Maternal Grandfather   . Liver cancer Maternal Grandfather   . Heart attack Paternal Grandfather   . Diabetes Paternal Grandfather   . Hypertension Paternal Grandfather   . Skin cancer Mother   . Breast cancer Neg Hx       ALLERGIES: Patient has no known allergies.  Current Outpatient Medications on File Prior to Visit  Medication Sig Dispense Refill  . cetirizine (ZYRTEC) 10 MG tablet Take 10 mg by mouth daily.    . cyclobenzaprine (FLEXERIL) 10  MG tablet Take 1 tablet (10 mg total) by mouth at bedtime as needed for muscle spasms. 14 tablet 0  . omeprazole (PRILOSEC) 20 MG capsule Take 1 capsule (20 mg total) by mouth daily. 30 capsule 1   No current facility-administered medications on file prior to visit.     Social History   Tobacco Use  . Smoking status: Former Smoker    Packs/day: 1.00  . Smokeless tobacco: Never Used  Substance Use Topics  . Alcohol use: Yes    Alcohol/week: 1.0 standard drinks    Types: 1 Shots of liquor per week    Comment: occasionally  . Drug use: No    Review of Systems  Constitutional: Negative for chills, fever and unexpected weight change.  HENT: Negative for congestion.   Respiratory: Negative for cough.   Cardiovascular: Negative for chest pain, palpitations and leg swelling.  Gastrointestinal: Negative for abdominal distention, abdominal pain, nausea and vomiting.  Genitourinary: Positive for vaginal discharge. Negative for flank pain, pelvic pain, vaginal bleeding and vaginal pain.  Musculoskeletal: Negative for arthralgias and myalgias.  Skin: Negative for Reither.  Neurological: Negative for headaches.  Hematological: Negative for adenopathy.  Psychiatric/Behavioral: Negative for confusion.      Objective:    BP 110/62 (BP Location: Left Arm, Patient Position: Sitting, Cuff  Size: Large)   Pulse 82   Temp 98.7 F (37.1 C)   Ht 5' 1.75" (1.568 m)   Wt 149 lb 3.2 oz (67.7 kg)   SpO2 97%   BMI 27.51 kg/m   BP Readings from Last 3 Encounters:  11/02/18 110/62  10/03/18 102/70  09/13/17 118/69   Wt Readings from Last 3 Encounters:  11/02/18 149 lb 3.2 oz (67.7 kg)  10/03/18 146 lb 12.8 oz (66.6 kg)  01/05/17 138 lb (62.6 kg)    Physical Exam Vitals signs reviewed.  Constitutional:      Appearance: She is well-developed.  Eyes:     Conjunctiva/sclera: Conjunctivae normal.  Neck:     Thyroid: No thyroid mass or thyromegaly.  Cardiovascular:     Rate and Rhythm:  Normal rate and regular rhythm.     Pulses: Normal pulses.     Heart sounds: Normal heart sounds.  Pulmonary:     Effort: Pulmonary effort is normal.     Breath sounds: Normal breath sounds. No wheezing, rhonchi or rales.  Chest:     Breasts: Breasts are symmetrical.        Right: No inverted nipple, mass, nipple discharge, skin change or tenderness.        Left: No inverted nipple, mass, nipple discharge, skin change or tenderness.  Genitourinary:    Cervix: No cervical motion tenderness, discharge or friability.     Uterus: Not enlarged, not fixed and not tender.      Adnexa:        Right: No mass, tenderness or fullness.         Left: No mass, tenderness or fullness.       Comments: Pap performed. No CMT. Unable to appreciated ovaries. Musculoskeletal:       Arms:     Comments: 2 discrete less than 1 cm masses appreciated posterior left arm as noted on diagram.  Nonfluctuant.  Nontender.  No erythema, increased warmth.  Lymphadenopathy:     Head:     Right side of head: No submental, submandibular, tonsillar, preauricular, posterior auricular or occipital adenopathy.     Left side of head: No submental, submandibular, tonsillar, preauricular, posterior auricular or occipital adenopathy.     Cervical:     Right cervical: No superficial, deep or posterior cervical adenopathy.    Left cervical: No superficial, deep or posterior cervical adenopathy.     Upper Body:     Right upper body: No pectoral adenopathy.     Left upper body: No pectoral adenopathy.  Skin:    General: Skin is warm and dry.     Comments: Flat appearing nevi right side of nose.  Neurological:     Mental Status: She is alert.  Psychiatric:        Speech: Speech normal.        Behavior: Behavior normal.        Thought Content: Thought content normal.        Assessment & Plan:   Problem List Items Addressed This Visit      Other   Routine physical examination - Primary    Clinical breast exam  performed today.  Baseline mammogram has been ordered.  Pap performed.  Influenza vaccine given.  Refer to dermatology for evaluation of lesion on nose.      Relevant Orders   [place] MiscellaneoUS Localization   Ambulatory referral to Dermatology   TSH   Hemoglobin A1c   Lipid panel   VITAMIN D  25 Hydroxy (Vit-D Deficiency, Fractures)   Cytology - PAP   Cervicovaginal ancillary only   Urinalysis, Routine w reflex microscopic   Urine Culture   MM 3D SCREEN BREAST BILATERAL   Lipoma    Suspected lipoma as it not in location of axillary or epitrochlear area.  No B symptoms .no other masses appreciated.  Pending ultrasound.      Relevant Orders   [place] MiscellaneoUS Localization   Vaginal discharge    Suspected yeast infection.  Pending culture, urinary studies          I am having Tamaria Schomer maintain her cetirizine, cyclobenzaprine, and omeprazole.   No orders of the defined types were placed in this encounter.   Return precautions given.   Risks, benefits, and alternatives of the medications and treatment plan prescribed today were discussed, and patient expressed understanding.   Education regarding symptom management and diagnosis given to patient on AVS.   Continue to follow with Burnard Hawthorne, FNP for routine health maintenance.   Beyonce Melcher and I agreed with plan.   Mable Paris, FNP

## 2018-11-02 NOTE — Assessment & Plan Note (Signed)
Resolved on Prilosec.  Education advised on long-term use of PPIs.  Patient will attempt to wean off Prilosec and with contrll with  lifestyle modifications.

## 2018-11-02 NOTE — Assessment & Plan Note (Signed)
Clinical breast exam performed today.  Baseline mammogram has been ordered.  Pap performed.  Influenza vaccine given.  Refer to dermatology for evaluation of lesion on nose.

## 2018-11-02 NOTE — Patient Instructions (Addendum)
Please call call and schedule your BASELINE 3D mammogram as discussed.   [place]  [place], [place]   Today we discussed referrals, orders.  Ultrasound, dermatology   I have placed these orders in the system for you.  Please be sure to give [place] a call if you have not heard from our office regarding this. We should hear from [place] within ONE week with information regarding your appointment. If not, please let me know immediately.     Long term use beyond 3 months of proton pump inhibitors , also called PPI's, is associated with malabsorption of vitamins, chronic kidney disease, fracture risk, and diarrheal illnesses. PPI's include Nexium, Prilosec, Protonix, Dexilant, and Prevacid.   I generally recommend trying to control acid reflux with lifestyle modifications including avoiding trigger foods, not eating 2 hours prior to bedtime. You may use histamine 2 blockers daily to twice daily ( this is Zantac, Pepcid) and then when symptoms flare, start back on PPI for short course.   Of note, we will need to do an endoscopy ( upper GI) to evaluate your esophagus, stomach in the future if acid reflux persists are you develop red flag symptoms: trouble swallowing, hoarseness, chronic cough, unexplained weight loss.  Health Maintenance, Female Adopting a healthy lifestyle and getting preventive care can go a long way to promote health and wellness. Talk with your health care provider about what schedule of regular examinations is right for you. This is a good chance for you to check in with your provider about disease prevention and staying healthy. In between checkups, there are plenty of things you can do on your own. Experts have done a lot of research about which lifestyle changes and preventive measures are most likely to keep you healthy. Ask your health care provider for more information. Weight and diet Eat a healthy diet  Be sure to include  plenty of vegetables, fruits, low-fat dairy products, and lean protein.  Do not eat a lot of foods high in solid fats, added sugars, or salt.  Get regular exercise. This is one of the most important things you can do for your health. ? Most adults should exercise for at least 150 minutes each week. The exercise should increase your heart rate and make you sweat (moderate-intensity exercise). ? Most adults should also do strengthening exercises at least twice a week. This is in addition to the moderate-intensity exercise. Maintain a healthy weight  Body mass index (BMI) is a measurement that can be used to identify possible weight problems. It estimates body fat based on height and weight. Your health care provider can help determine your BMI and help you achieve or maintain a healthy weight.  For females 38 years of age and older: ? A BMI below 18.5 is considered underweight. ? A BMI of 18.5 to 24.9 is normal. ? A BMI of 25 to 29.9 is considered overweight. ? A BMI of 30 and above is considered obese. Watch levels of cholesterol and blood lipids  You should start having your blood tested for lipids and cholesterol at 38 years of age, then have this test every 5 years.  You may need to have your cholesterol levels checked more often if: ? Your lipid or cholesterol levels are high. ? You are older than 38 years of age. ? You are at high risk for heart disease. Cancer screening Lung Cancer  Lung cancer screening is recommended for adults 55-80 years  old who are at high risk for lung cancer because of a history of smoking.  A yearly low-dose CT scan of the lungs is recommended for people who: ? Currently smoke. ? Have quit within the past 15 years. ? Have at least a 30-pack-year history of smoking. A pack year is smoking an average of one pack of cigarettes a day for 1 year.  Yearly screening should continue until it has been 15 years since you quit.  Yearly screening should stop if  you develop a health problem that would prevent you from having lung cancer treatment. Breast Cancer  Practice breast self-awareness. This means understanding how your breasts normally appear and feel.  It also means doing regular breast self-exams. Let your health care provider know about any changes, no matter how small.  If you are in your 20s or 30s, you should have a clinical breast exam (CBE) by a health care provider every 1-3 years as part of a regular health exam.  If you are 22 or older, have a CBE every year. Also consider having a breast X-ray (mammogram) every year.  If you have a family history of breast cancer, talk to your health care provider about genetic screening.  If you are at high risk for breast cancer, talk to your health care provider about having an MRI and a mammogram every year.  Breast cancer gene (BRCA) assessment is recommended for women who have family members with BRCA-related cancers. BRCA-related cancers include: ? Breast. ? Ovarian. ? Tubal. ? Peritoneal cancers.  Results of the assessment will determine the need for genetic counseling and BRCA1 and BRCA2 testing. Cervical Cancer Your health care provider may recommend that you be screened regularly for cancer of the pelvic organs (ovaries, uterus, and vagina). This screening involves a pelvic examination, including checking for microscopic changes to the surface of your cervix (Pap test). You may be encouraged to have this screening done every 3 years, beginning at age 46.  For women ages 35-65, health care providers may recommend pelvic exams and Pap testing every 3 years, or they may recommend the Pap and pelvic exam, combined with testing for human papilloma virus (HPV), every 5 years. Some types of HPV increase your risk of cervical cancer. Testing for HPV may also be done on women of any age with unclear Pap test results.  Other health care providers may not recommend any screening for nonpregnant  women who are considered low risk for pelvic cancer and who do not have symptoms. Ask your health care provider if a screening pelvic exam is right for you.  If you have had past treatment for cervical cancer or a condition that could lead to cancer, you need Pap tests and screening for cancer for at least 20 years after your treatment. If Pap tests have been discontinued, your risk factors (such as having a new sexual partner) need to be reassessed to determine if screening should resume. Some women have medical problems that increase the chance of getting cervical cancer. In these cases, your health care provider may recommend more frequent screening and Pap tests. Colorectal Cancer  This type of cancer can be detected and often prevented.  Routine colorectal cancer screening usually begins at 38 years of age and continues through 38 years of age.  Your health care provider may recommend screening at an earlier age if you have risk factors for colon cancer.  Your health care provider may also recommend using home test kits to check  for hidden blood in the stool.  A small camera at the end of a tube can be used to examine your colon directly (sigmoidoscopy or colonoscopy). This is done to check for the earliest forms of colorectal cancer.  Routine screening usually begins at age 35.  Direct examination of the colon should be repeated every 5-10 years through 38 years of age. However, you may need to be screened more often if early forms of precancerous polyps or small growths are found. Skin Cancer  Check your skin from head to toe regularly.  Tell your health care provider about any new moles or changes in moles, especially if there is a change in a mole's shape or color.  Also tell your health care provider if you have a mole that is larger than the size of a pencil eraser.  Always use sunscreen. Apply sunscreen liberally and repeatedly throughout the day.  Protect yourself by wearing  long sleeves, pants, a wide-brimmed hat, and sunglasses whenever you are outside. Heart disease, diabetes, and high blood pressure  High blood pressure causes heart disease and increases the risk of stroke. High blood pressure is more likely to develop in: ? People who have blood pressure in the high end of the normal range (130-139/85-89 mm Hg). ? People who are overweight or obese. ? People who are African American.  If you are 77-47 years of age, have your blood pressure checked every 3-5 years. If you are 75 years of age or older, have your blood pressure checked every year. You should have your blood pressure measured twice-once when you are at a hospital or clinic, and once when you are not at a hospital or clinic. Record the average of the two measurements. To check your blood pressure when you are not at a hospital or clinic, you can use: ? An automated blood pressure machine at a pharmacy. ? A home blood pressure monitor.  If you are between 32 years and 72 years old, ask your health care provider if you should take aspirin to prevent strokes.  Have regular diabetes screenings. This involves taking a blood sample to check your fasting blood sugar level. ? If you are at a normal weight and have a low risk for diabetes, have this test once every three years after 38 years of age. ? If you are overweight and have a high risk for diabetes, consider being tested at a younger age or more often. Preventing infection Hepatitis B  If you have a higher risk for hepatitis B, you should be screened for this virus. You are considered at high risk for hepatitis B if: ? You were born in a country where hepatitis B is common. Ask your health care provider which countries are considered high risk. ? Your parents were born in a high-risk country, and you have not been immunized against hepatitis B (hepatitis B vaccine). ? You have HIV or AIDS. ? You use needles to inject street drugs. ? You live with  someone who has hepatitis B. ? You have had sex with someone who has hepatitis B. ? You get hemodialysis treatment. ? You take certain medicines for conditions, including cancer, organ transplantation, and autoimmune conditions. Hepatitis C  Blood testing is recommended for: ? Everyone born from [birth year] through 1965. ? Anyone with known risk factors for hepatitis C. Sexually transmitted infections (STIs)  You should be screened for sexually transmitted infections (STIs) including gonorrhea and chlamydia if: ? You are sexually active and are  younger than 38 years of age. ? You are older than 38 years of age and your health care provider tells you that you are at risk for this type of infection. ? Your sexual activity has changed since you were last screened and you are at an increased risk for chlamydia or gonorrhea. Ask your health care provider if you are at risk.  If you do not have HIV, but are at risk, it may be recommended that you take a prescription medicine daily to prevent HIV infection. This is called pre-exposure prophylaxis (PrEP). You are considered at risk if: ? You are sexually active and do not regularly use condoms or know the HIV status of your partner(s). ? You take drugs by injection. ? You are sexually active with a partner who has HIV. Talk with your health care provider about whether you are at high risk of being infected with HIV. If you choose to begin PrEP, you should first be tested for HIV. You should then be tested every 3 months for as long as you are taking PrEP. Pregnancy  If you are premenopausal and you may become pregnant, ask your health care provider about preconception counseling.  If you may become pregnant, take 400 to 800 micrograms (mcg) of folic acid every day.  If you want to prevent pregnancy, talk to your health care provider about birth control (contraception). Osteoporosis and menopause  Osteoporosis is a disease in which the bones lose  minerals and strength with aging. This can result in serious bone fractures. Your risk for osteoporosis can be identified using a bone density scan.  If you are 72 years of age or older, or if you are at risk for osteoporosis and fractures, ask your health care provider if you should be screened.  Ask your health care provider whether you should take a calcium or vitamin D supplement to lower your risk for osteoporosis.  Menopause may have certain physical symptoms and risks.  Hormone replacement therapy may reduce some of these symptoms and risks. Talk to your health care provider about whether hormone replacement therapy is right for you. Follow these instructions at home:  Schedule regular health, dental, and eye exams.  Stay current with your immunizations.  Do not use any tobacco products including cigarettes, chewing tobacco, or electronic cigarettes.  If you are pregnant, do not drink alcohol.  If you are breastfeeding, limit how much and how often you drink alcohol.  Limit alcohol intake to no more than 1 drink per day for nonpregnant women. One drink equals 12 ounces of beer, 5 ounces of wine, or 1 ounces of hard liquor.  Do not use street drugs.  Do not share needles.  Ask your health care provider for help if you need support or information about quitting drugs.  Tell your health care provider if you often feel depressed.  Tell your health care provider if you have ever been abused or do not feel safe at home. This information is not intended to replace advice given to you by your health care provider. Make sure you discuss any questions you have with your health care provider. Document Released: 03/02/2011 Document Revised: 01/23/2016 Document Reviewed: 05/21/2015 Elsevier Interactive Patient Education  2019 [employer].

## 2018-11-02 NOTE — Assessment & Plan Note (Signed)
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  No B symptoms .no other masses appreciated.  Pending ultrasound.

## 2018-11-03 LAB — URINE CULTURE
MICRO NUMBER:: 275760
Result:: NO GROWTH
SPECIMEN QUALITY:: ADEQUATE

## 2018-11-03 LAB — CERVICOVAGINAL ANCILLARY ONLY
Bacterial vaginitis: POSITIVE — AB
CANDIDA VAGINITIS: NEGATIVE

## 2018-11-04 ENCOUNTER — Other Ambulatory Visit: Payer: Self-pay | Admitting: Family

## 2018-11-04 ENCOUNTER — Telehealth: Payer: Self-pay | Admitting: Family

## 2018-11-04 DIAGNOSIS — N760 Acute vaginitis: Principal | ICD-10-CM

## 2018-11-04 DIAGNOSIS — B9689 Other specified bacterial agents as the cause of diseases classified elsewhere: Secondary | ICD-10-CM

## 2018-11-04 LAB — CYTOLOGY - PAP
Diagnosis: UNDETERMINED — AB
HPV: NOT DETECTED

## 2018-11-04 MED ORDER — METRONIDAZOLE 500 MG PO TABS: 500.0000 mg | ORAL_TABLET | Freq: Two times a day (BID) | ORAL | 0 refills | Status: DC

## 2018-11-04 NOTE — Telephone Encounter (Signed)
LMTCB. PEC may speak with patient & it appears that she has seen mychart message. Please ensure that she picks up prescription.

## 2018-11-04 NOTE — Telephone Encounter (Signed)
Call patient Please ensure she see my chart result notes. I sent in Antibiotic for bacterial vaginitis.

## 2018-11-07 ENCOUNTER — Encounter: Payer: Self-pay | Admitting: Family

## 2018-11-15 ENCOUNTER — Telehealth: Payer: Self-pay | Admitting: Family

## 2018-11-15 ENCOUNTER — Other Ambulatory Visit: Payer: Self-pay | Admitting: Family

## 2018-11-15 DIAGNOSIS — D179 Benign lipomatous neoplasm, unspecified: Secondary | ICD-10-CM

## 2018-11-15 NOTE — Telephone Encounter (Signed)
The order that was put in for [place] misc needs to be [id number]. Please advise? Thank you!

## 2018-11-16 NOTE — Telephone Encounter (Signed)
I have ordered correctly  Are you scheduling this for pt?  Thanks Rasheedah!

## 2018-11-25 ENCOUNTER — Encounter: Payer: Self-pay | Admitting: Family

## 2018-11-27 ENCOUNTER — Other Ambulatory Visit: Payer: Self-pay | Admitting: Family

## 2018-11-27 DIAGNOSIS — R1013 Epigastric pain: Secondary | ICD-10-CM

## 2018-12-09 NOTE — Telephone Encounter (Signed)
Pt returned call from [place] regarding Ultrasound appointment. Please advise.

## 2018-12-15 NOTE — Telephone Encounter (Signed)
Ok. I left msg for pt about scheduling [place]. I left msg today with the number for pt to call and schedule [place].

## 2018-12-16 NOTE — Telephone Encounter (Signed)
noted 

## 2018-12-22 ENCOUNTER — Telehealth: Payer: 59 | Admitting: Nurse Practitioner

## 2018-12-22 DIAGNOSIS — R399 Unspecified symptoms and signs involving the genitourinary system: Secondary | ICD-10-CM | POA: Diagnosis not present

## 2018-12-22 MED ORDER — CEPHALEXIN 500 MG PO CAPS: 500.0000 mg | ORAL_CAPSULE | Freq: Two times a day (BID) | ORAL | 0 refills | Status: AC

## 2018-12-22 NOTE — Progress Notes (Signed)

## 2018-12-26 DIAGNOSIS — D179 Benign lipomatous neoplasm, unspecified: Secondary | ICD-10-CM | POA: Diagnosis not present

## 2018-12-26 DIAGNOSIS — L570 Actinic keratosis: Secondary | ICD-10-CM | POA: Diagnosis not present

## 2018-12-26 DIAGNOSIS — L219 Seborrheic dermatitis, unspecified: Secondary | ICD-10-CM | POA: Diagnosis not present

## 2018-12-28 NOTE — Telephone Encounter (Signed)
Pt viewed mychart 11/04/18

## 2019-02-21 ENCOUNTER — Ambulatory Visit
Admission: RE | Admit: 2019-02-21 | Discharge: 2019-02-21 | Disposition: A | Discharge status: Home / Self Care | Payer: 59 | Source: Ambulatory Visit | Attending: Family | Admitting: Family

## 2019-02-21 ENCOUNTER — Other Ambulatory Visit: Payer: Self-pay

## 2019-02-21 DIAGNOSIS — Z0000 Encounter for general adult medical examination without abnormal findings: Secondary | ICD-10-CM | POA: Insufficient documentation

## 2019-02-21 DIAGNOSIS — Z1231 Encounter for screening mammogram for malignant neoplasm of breast: Secondary | ICD-10-CM | POA: Insufficient documentation

## 2019-03-27 DIAGNOSIS — L219 Seborrheic dermatitis, unspecified: Secondary | ICD-10-CM | POA: Diagnosis not present

## 2019-03-27 DIAGNOSIS — Z872 Personal history of diseases of the skin and subcutaneous tissue: Secondary | ICD-10-CM | POA: Diagnosis not present

## 2019-05-01 ENCOUNTER — Telehealth: Payer: Self-pay

## 2019-05-01 NOTE — Telephone Encounter (Signed)
Patient said that symptoms of nausea, loose stool, and headaches started on last Thursday.  Denies having any other symptoms.    Patient scheduled virtual visit with Philis Nettle, NP on tomorrow morning (05/02/19) at 8:20 am.

## 2019-05-01 NOTE — Telephone Encounter (Signed)
Copied from [place] [id number]. Topic: General - Other >> May 01, 2019  3:13 PM Leward Quan A wrote: Reason for CRM: Patient would like a call back to schedule an appointment to be seen on 05/02/2019 she is having nausea, irregular stools and headaches. Job need her to be cleared by PCP before coming to work. Ph# [phone]

## 2019-05-02 ENCOUNTER — Ambulatory Visit (INDEPENDENT_AMBULATORY_CARE_PROVIDER_SITE_OTHER): Payer: 59 | Admitting: Family Medicine

## 2019-05-02 ENCOUNTER — Other Ambulatory Visit: Payer: Self-pay

## 2019-05-02 ENCOUNTER — Other Ambulatory Visit: Payer: Self-pay | Admitting: Emergency Medicine

## 2019-05-02 DIAGNOSIS — R6889 Other general symptoms and signs: Secondary | ICD-10-CM | POA: Diagnosis not present

## 2019-05-02 DIAGNOSIS — R110 Nausea: Secondary | ICD-10-CM | POA: Diagnosis not present

## 2019-05-02 DIAGNOSIS — Z20822 Contact with and (suspected) exposure to covid-19: Secondary | ICD-10-CM

## 2019-05-02 DIAGNOSIS — R51 Headache: Secondary | ICD-10-CM | POA: Diagnosis not present

## 2019-05-02 DIAGNOSIS — R197 Diarrhea, unspecified: Secondary | ICD-10-CM

## 2019-05-02 MED ORDER — ONDANSETRON 4 MG PO TBDP: 4.0000 mg | ORAL_TABLET | Freq: Three times a day (TID) | ORAL | 0 refills | Status: DC | PRN

## 2019-05-02 NOTE — Progress Notes (Signed)
Patient ID: Ariceli Iacovelli, female   DOB: 1980-09-26, 38 y.o.   MRN: [id number]    Virtual Visit via video Note  This visit type was conducted due to national recommendations for restrictions regarding the COVID-19 pandemic (e.g. social distancing).  This format is felt to be most appropriate for this patient at this time.  All issues noted in this document were discussed and addressed.  No physical exam was performed (except for noted visual exam findings with Video Visits).   I connected with Zakyla Schreffler today at  8:20 AM EDT by a video enabled telemedicine application and verified that I am speaking with the correct person using two identifiers. Location patient: home Location provider: work or home office Persons participating in the virtual visit: patient, provider  I discussed the limitations, risks, security and privacy concerns of performing an evaluation and management service by video and the availability of in person appointments. I also discussed with the patient that there may be a patient responsible charge related to this service. The patient expressed understanding and agreed to proceed.   HPI:  Patient and I connected via video due to complaints of headache, diarrhea and nausea has been present since late last week.  States she just feels generally tired, diarrhea waxes and wanes, she is able to drink fluids and eat small amounts of food.  No vomiting.  No cough, shortness of breath or wheezing.  No chest pain.  No urinary issues.   Tylenol does help to reduce headache.  Patient states due to her symptoms - her job requested she be evaluated and possibly be tested for COVID-19.   ROS: See pertinent positives and negatives per HPI.  Past Medical History:  Diagnosis Date  . Dysmenorrhea   . [id number])     Past Surgical History:  Procedure Laterality Date  . TUBAL LIGATION  2005    Family History  Problem Relation Age of Onset  . Hypertension Father   . ADD /  ADHD Brother   . Emphysema Maternal Grandmother   . Cancer Maternal Grandfather 10       colon  . Alcohol abuse Maternal Grandfather   . Liver cancer Maternal Grandfather   . Heart attack Paternal Grandfather   . Diabetes Paternal Grandfather   . Hypertension Paternal Grandfather   . Skin cancer Mother   . Breast cancer Neg Hx    Social History   Tobacco Use  . Smoking status: Former Smoker    Packs/day: 1.00  . Smokeless tobacco: Never Used  Substance Use Topics  . Alcohol use: Yes    Alcohol/week: 1.0 standard drinks    Types: 1 Shots of liquor per week    Comment: occasionally    Current Outpatient Medications:  .  cetirizine (ZYRTEC) 10 MG tablet, Take 10 mg by mouth daily., Disp: , Rfl:  .  cyclobenzaprine (FLEXERIL) 10 MG tablet, Take 1 tablet (10 mg total) by mouth at bedtime as needed for muscle spasms., Disp: 14 tablet, Rfl: 0 .  metroNIDAZOLE (FLAGYL) 500 MG tablet, Take 1 tablet (500 mg total) by mouth 2 (two) times daily., Disp: 14 tablet, Rfl: 0 .  omeprazole (PRILOSEC) 20 MG capsule, TAKE 1 CAPSULE BY MOUTH EVERY DAY, Disp: 90 capsule, Rfl: 1  EXAM:  GENERAL: alert, oriented, appears well and in no acute distress  HEENT: atraumatic, conjunttiva clear, no obvious abnormalities on inspection of external nose and ears  NECK: normal movements of the head and neck  LUNGS: on inspection  no signs of respiratory distress, breathing rate appears normal, no obvious gross SOB, gasping or wheezing  CV: no obvious cyanosis  MS: moves all visible extremities without noticeable abnormality  PSYCH/NEURO: pleasant and cooperative, no obvious depression or anxiety, speech and thought processing grossly intact  ASSESSMENT AND PLAN:  Discussed the following assessment and plan:   Suspected COVID-19 virus infection, diarrhea, headache, nausea - advised that due to symptoms, we need to get patient set up for COVID-19 testing.  Patient advised that I will put order in and  he can go to testing location for for drive-through testing. Patient given the address of testing site.  Patient advised that testing is taking 2 to 7 days to result, and while we are awaiting results patient must remain under self quarantine and monitor for any changing/worsening symptoms.  Advised over-the-counter medications such as Tylenol can be used to help treat pain or fevers, Robitussin can be used to help calm cough, allergy medication such as Claritin or Allegra can help reduce congestion.  Zofran ODT Rx sent to use PRN. Also discussed getting plenty of rest and increasing fluid intake.  Made patient aware that test results as well as how his symptoms progress will determine when the self quarantine will be able to end.  Also advised to monitor self for any worsening symptoms, advised if severe shortness of breath develops, high fever that is not reduced with use of Tylenol, chest pain, severe vomiting or diarrhea  --patient must call on-call and or go to ER right away for evaluation. patient verbalized understanding of these instructions.    I discussed the assessment and treatment plan with the patient. The patient was provided an opportunity to ask questions and all were answered. The patient agreed with the plan and demonstrated an understanding of the instructions.   The patient was advised to call back or seek an in-person evaluation if the symptoms worsen or if the condition fails to improve as anticipated.   Jodelle Green, FNP

## 2019-05-03 NOTE — Telephone Encounter (Signed)
Seen by lauren

## 2019-05-04 LAB — NOVEL CORONAVIRUS, NAA: SARS-CoV-2, NAA: NOT DETECTED

## 2019-05-05 ENCOUNTER — Telehealth: Payer: 59 | Admitting: Nurse Practitioner

## 2019-05-05 DIAGNOSIS — G43811 Other migraine, intractable, with status migrainosus: Secondary | ICD-10-CM

## 2019-05-05 NOTE — Progress Notes (Signed)
Based on what you shared with me it looks like you have headache that should be evaluated in a face to face office visit. We do not treat migraines or headaches in an evisit. You will have to contact your PCP or go to urgent care for treatment.   NOTE: If you entered your credit card information for this eVisit, you will not be charged. You may see a "hold" on your card for the $30 but that hold will drop off and you will not have a charge processed.  If you are having a true medical emergency please call 911.  If you need an urgent face to face visit, [place] has four urgent care centers for your convenience.  If you need care fast and have a high deductible or no insurance consider:   [email or web address] to reserve your spot online an avoid wait times  [facility] [address], [id number] [place], [place] [address] 8 am to 8 pm Monday-Friday 10 am to 4 pm Saturday-Sunday *Across the street from [employer]  [place], [address] 8 am to 5 pm Monday-Friday * In the [facility] on the [facility]   The following sites will take your  insurance:  . [facility] Urgent [place] a Provider at this Location  [address] [place], [place] [address] . 10 am to 8 pm Monday-Friday . 12 pm to 8 pm Saturday-Sunday   . [facility]gent [facility] at [place] a Provider at this Location  [address] [place] [place], [place] [address] . 8 am to 8 pm Monday-Friday . 9 am to 6 pm Saturday . 11 am to 6 pm Sunday   . [facility] at [place] Get Driving Directions  [id number] [place], [place] [address] . 8 am to 8 pm Monday-Friday . 8 am to 4 pm Saturday-Sunday   Your e-visit answers were reviewed by a board certified advanced clinical practitioner to  complete your personal care plan.

## 2019-05-23 ENCOUNTER — Encounter (HOSPITAL_COMMUNITY): Payer: Self-pay | Admitting: Emergency Medicine

## 2019-05-23 ENCOUNTER — Telehealth (HOSPITAL_COMMUNITY): Payer: Self-pay | Admitting: Emergency Medicine

## 2019-05-23 ENCOUNTER — Ambulatory Visit (HOSPITAL_COMMUNITY)
Admission: EM | Admit: 2019-05-23 | Discharge: 2019-05-23 | Disposition: A | Discharge status: Home / Self Care | Payer: 59

## 2019-05-23 DIAGNOSIS — R51 Headache: Secondary | ICD-10-CM | POA: Diagnosis not present

## 2019-05-23 DIAGNOSIS — R519 Headache, unspecified: Secondary | ICD-10-CM

## 2019-05-23 MED ORDER — IBUPROFEN 800 MG PO TABS: 800.0000 mg | ORAL_TABLET | Freq: Three times a day (TID) | ORAL | 0 refills | Status: DC | PRN

## 2019-05-23 MED ORDER — CYCLOBENZAPRINE HCL 10 MG PO TABS: 10.0000 mg | ORAL_TABLET | Freq: Two times a day (BID) | ORAL | 0 refills | Status: DC | PRN

## 2019-05-23 MED ORDER — KETOROLAC TROMETHAMINE 30 MG/ML IJ SOLN
INTRAMUSCULAR | Status: AC
  Filled 2019-05-23: qty 1

## 2019-05-23 MED ORDER — KETOROLAC TROMETHAMINE 30 MG/ML IJ SOLN
30.0000 mg | Freq: Once | INTRAMUSCULAR | Status: AC
  Administered 2019-05-23: 15:00:00 30 mg via INTRAMUSCULAR

## 2019-05-23 NOTE — Discharge Instructions (Addendum)
You were given an injection of a pain medication called Toradol today.    You can take the prescribed ibuprofen every 8 hours as needed starting tomorrow.  Do not take additional over-the-counter ibuprofen or Aleve while taking this medication.    You can take the muscle relaxer cyclobenzaprine as prescribed.  Do not drive, operate machinery, or drink alcohol with this medication as it may cause drowsiness.    Follow-up as scheduled with your primary care provider in 2 days.    Go to the emergency department if you experience acute worsening headache; or new symptoms such as weakness, dizziness, or other concerns.

## 2019-05-23 NOTE — ED Triage Notes (Signed)
Patient reports she has 3 weeks with headache, she was tested for Covid and came back negative on 05/02/2019.  She Excedrin, Tylenol, Aleve, ibuprofen, none of this medications are working.  She has an appointment with  with her PCP on 05/25/2019 for the same chief complaint

## 2019-05-23 NOTE — ED Provider Notes (Signed)
[place]    CSN: [id number] Arrival date & time: 05/23/19  1451      History   Chief Complaint Chief Complaint  Patient presents with  . Headache    HPI Carolyn Chambers is a 38 y.o. female.   Patient presents with 3-week history of headache.  She describes the headache as band-like around her head.  She had a video visit with her PCP on 05/02/2019; COVID test was negative.  She has attempted treatment at home with Tylenol and Excedrin which initially were working but have not been helping over the last few days.  She denies dizziness, weakness, vision changes, fever, chills, sore throat, chest pain, palpitations, cough, shortness of breath, nausea, vomiting, diarrhea, Koestner, or other symptoms.  LMP: 05/13/2019, tubal ligation.  Patient has an appointment with her PCP on 05/25/2019 for follow-up.  The history is provided by the patient.    Past Medical History:  Diagnosis Date  . Dysmenorrhea   . [id number])     Patient Active Problem List   Diagnosis Date Noted  . Routine physical examination 11/02/2018  . Lipoma 11/02/2018  . Vaginal discharge 11/02/2018  . Epigastric pain 10/05/2018  . Voisin and nonspecific skin eruption 09/22/2016  . Acute bilateral low back pain 09/22/2016  . Tension headache 05/01/2016  . Preventative health care 07/12/2015    Past Surgical History:  Procedure Laterality Date  . TUBAL LIGATION  2005    OB History    Gravida  2   Para  2   Term  2   Preterm      AB      Living  2     SAB      TAB      Ectopic      Multiple      Live Births               Home Medications    Prior to Admission medications   Medication Sig Start Date End Date Taking? Authorizing Provider  acetaminophen (TYLENOL) 500 MG tablet Take 500 mg by mouth every 6 (six) hours as needed.   Yes [provider]  aspirin-acetaminophen-caffeine (EXCEDRIN MIGRAINE) 585 [id number] MG tablet Take by mouth every 6 (six) hours as needed for  headache.   Yes [provider]  naproxen sodium (ALEVE) 220 MG tablet Take 220 mg by mouth.   Yes [provider]  cetirizine (ZYRTEC) 10 MG tablet Take 10 mg by mouth daily.    [provider]  cyclobenzaprine (FLEXERIL) 10 MG tablet Take 1 tablet (10 mg total) by mouth 2 (two) times daily as needed for muscle spasms. 05/23/19   Sharion Balloon, NP  ibuprofen (ADVIL) 800 MG tablet Take 1 tablet (800 mg total) by mouth every 8 (eight) hours as needed. 05/24/19   Sharion Balloon, NP  metroNIDAZOLE (FLAGYL) 500 MG tablet Take 1 tablet (500 mg total) by mouth 2 (two) times daily. 11/04/18   Burnard Hawthorne, FNP  omeprazole (PRILOSEC) 20 MG capsule TAKE 1 CAPSULE BY MOUTH EVERY DAY 11/28/18   Burnard Hawthorne, FNP  ondansetron (ZOFRAN ODT) 4 MG disintegrating tablet Take 1 tablet (4 mg total) by mouth every 8 (eight) hours as needed for nausea or vomiting. 05/02/19   Jodelle Green, FNP    Family History Family History  Problem Relation Age of Onset  . Hypertension Father   . ADD / ADHD Brother   . Emphysema Maternal Grandmother   .  Cancer Maternal Grandfather 34       colon  . Alcohol abuse Maternal Grandfather   . Liver cancer Maternal Grandfather   . Heart attack Paternal Grandfather   . Diabetes Paternal Grandfather   . Hypertension Paternal Grandfather   . Skin cancer Mother   . Breast cancer Neg Hx     Social History Social History   Tobacco Use  . Smoking status: Former Smoker    Packs/day: 1.00  . Smokeless tobacco: Never Used  Substance Use Topics  . Alcohol use: Yes    Alcohol/week: 1.0 standard drinks    Types: 1 Shots of liquor per week    Comment: occasionally  . Drug use: No     Allergies   Patient has no known allergies.   Review of Systems Review of Systems  Constitutional: Negative for chills and fever.  HENT: Negative for congestion, ear pain, rhinorrhea and sore throat.   Eyes: Negative for pain and visual disturbance.   Respiratory: Negative for cough and shortness of breath.   Cardiovascular: Negative for chest pain and palpitations.  Gastrointestinal: Negative for abdominal pain, diarrhea and vomiting.  Genitourinary: Negative for dysuria and hematuria.  Musculoskeletal: Negative for arthralgias and back pain.  Skin: Negative for color change and [place].  Neurological: Positive for headaches. Negative for dizziness, tremors, seizures, syncope, facial asymmetry, speech difficulty, weakness, light-headedness and numbness.  All other systems reviewed and are negative.    Physical Exam Triage Vital Signs ED Triage Vitals [05/23/19 1503]  Enc Vitals Group     BP      Pulse      Resp      Temp      Temp src      SpO2      Weight      Height      Head Circumference      Peak Flow      Pain Score 9     Pain Loc      Pain Edu?      Excl. in [place]?    No data found.  Updated Vital Signs BP 113/78 (BP Location: Right Arm)   Pulse 85   Temp 98.6 F (37 C) (Oral)   Resp 16   SpO2 95%   Visual Acuity Right Eye Distance:   Left Eye Distance:   Bilateral Distance:    Right Eye Near:   Left Eye Near:    Bilateral Near:     Physical Exam Vitals signs and nursing note reviewed.  Constitutional:      General: She is not in acute distress.    Appearance: She is well-developed. She is not ill-appearing.  HENT:     Head: Normocephalic and atraumatic.     Right Ear: Tympanic membrane normal.     Left Ear: Tympanic membrane normal.     Nose: Nose normal.     Mouth/Throat:     Mouth: Mucous membranes are moist.     Pharynx: Oropharynx is clear.  Eyes:     Extraocular Movements: Extraocular movements intact.     Conjunctiva/sclera: Conjunctivae normal.     Pupils: Pupils are equal, round, and reactive to light.  Neck:     Musculoskeletal: Neck supple.  Cardiovascular:     Rate and Rhythm: Normal rate and regular rhythm.     Heart sounds: No murmur.  Pulmonary:     Effort: Pulmonary effort  is normal. No respiratory distress.     Breath sounds: Normal  breath sounds.  Abdominal:     Palpations: Abdomen is soft.     Tenderness: There is no abdominal tenderness. There is no guarding or rebound.  Skin:    General: Skin is warm and dry.     Findings: No [place].  Neurological:     General: No focal deficit present.     Mental Status: She is alert and oriented to person, place, and time.     Cranial Nerves: No cranial nerve deficit.     Sensory: No sensory deficit.     Motor: No weakness.     Coordination: Coordination normal.     Gait: Gait normal.     Deep Tendon Reflexes: Reflexes normal.  Psychiatric:        Mood and Affect: Mood normal.        Behavior: Behavior normal.      UC Treatments / Results  Labs (all labs ordered are listed, but only abnormal results are displayed) Labs Reviewed - No data to display  EKG   Radiology No results found.  Procedures Procedures (including critical care time)  Medications Ordered in UC Medications  ketorolac (TORADOL) 30 MG/ML injection 30 mg (30 mg Intramuscular Given 05/23/19 1526)  ketorolac (TORADOL) 30 MG/ML injection (has no administration in time range)    Initial Impression / Assessment and Plan / UC Course  I have reviewed the triage vital signs and the nursing notes.  Pertinent labs & imaging results that were available during my care of the patient were reviewed by me and considered in my medical decision making (see chart for details).    Acute headache.  Patient is well-appearing and her exam is unremarkable.  Treating with Toradol injection today, Flexeril as needed for muscle tension, start ibuprofen as needed tomorrow.  Instructed patient to follow-up as scheduled with her PCP in 2 days.  Instructed her to go to the emergency department if she develops acute worsening headache or new symptoms such as weakness or dizziness.  Patient agrees with plan of care.     Final Clinical Impressions(s) / UC  Diagnoses   Final diagnoses:  Acute nonintractable headache, unspecified headache type     Discharge Instructions     You were given an injection of a pain medication called Toradol today.    You can take the prescribed ibuprofen every 8 hours as needed starting tomorrow.  Do not take additional over-the-counter ibuprofen or Aleve while taking this medication.    You can take the muscle relaxer cyclobenzaprine as prescribed.  Do not drive, operate machinery, or drink alcohol with this medication as it may cause drowsiness.    Follow-up as scheduled with your primary care provider in 2 days.    Go to the emergency department if you experience acute worsening headache; or new symptoms such as weakness, dizziness, or other concerns.        ED Prescriptions    Medication Sig Dispense Auth. Provider   cyclobenzaprine (FLEXERIL) 10 MG tablet Take 1 tablet (10 mg total) by mouth 2 (two) times daily as needed for muscle spasms. 20 tablet Sharion Balloon, NP   ibuprofen (ADVIL) 800 MG tablet Take 1 tablet (800 mg total) by mouth every 8 (eight) hours as needed. 21 tablet Sharion Balloon, NP     I have reviewed the PDMP during this encounter.   Sharion Balloon, NP 05/23/19 1530

## 2019-05-23 NOTE — Telephone Encounter (Signed)
Needs meds sent to [facility]

## 2019-05-25 ENCOUNTER — Other Ambulatory Visit: Payer: Self-pay

## 2019-05-25 ENCOUNTER — Ambulatory Visit (INDEPENDENT_AMBULATORY_CARE_PROVIDER_SITE_OTHER): Payer: 59 | Admitting: Family Medicine

## 2019-05-25 DIAGNOSIS — G43019 Migraine without aura, intractable, without status migrainosus: Secondary | ICD-10-CM

## 2019-05-25 MED ORDER — SUMATRIPTAN SUCCINATE 50 MG PO TABS: 50.0000 mg | ORAL_TABLET | ORAL | 0 refills | Status: DC | PRN

## 2019-05-25 NOTE — Progress Notes (Signed)
Patient ID: Carolyn Chambers, female   DOB: [birth year], 38 y.o.   MRN: [id number]    Virtual Visit via video Note  This visit type was conducted due to national recommendations for restrictions regarding the COVID-19 pandemic (e.g. social distancing).  This format is felt to be most appropriate for this patient at this time.  All issues noted in this document were discussed and addressed.  No physical exam was performed (except for noted visual exam findings with Video Visits).   I connected with Jobina Blackwelder today at  8:00 AM EDT by a video enabled telemedicine application and verified that I am speaking with the correct person using two identifiers. Location patient: home Location provider: work or home office Persons participating in the virtual visit: patient, provider  I discussed the limitations, risks, security and privacy concerns of performing an evaluation and management service by video and the availability of in person appointments. I also discussed with the patient that there may be a patient responsible charge related to this service. The patient expressed understanding and agreed to proceed.   HPI:  Patient and I connected via video due to complaint of migraine headache and some nausea with diarrhea.  Patient does have a history of migraine headaches, but has not had an issue with them in the past few years.  Patient states she did go to urgent care on Tuesday of this week was given shot of Toradol and sent home with muscle relaxer, that only took the pain away for a short period of time but then the aching pain that is wrapping around head resurfaced.  Denies any sharp thunderclap pain in head, denies loss of visual field or floaters of visual field, denies facial droop, extremity weakness feeling faint or dizzy.  States sometimes the headache will be bad enough she feels nausea and then has had a few episodes of diarrhea.  Denies fever or chills.  No cough, shortness breath or wheezing.   Has been tested for COVID-19 earlier this month and test results were negative.  In the past using a Excedrin Migraine over-the-counter tablet has worked to break the headache, but that has not helped in this situation  ROS: See pertinent positives and negatives per HPI.  Past Medical History:  Diagnosis Date   Dysmenorrhea    Headache(784.0)     Past Surgical History:  Procedure Laterality Date   TUBAL LIGATION  2005    Family History  Problem Relation Age of Onset   Hypertension Father    ADD / ADHD Brother    Emphysema Maternal Grandmother    Cancer Maternal Grandfather 52       colon   Alcohol abuse Maternal Grandfather    Liver cancer Maternal Grandfather    Heart attack Paternal Grandfather    Diabetes Paternal Grandfather    Hypertension Paternal Grandfather    Skin cancer Mother    Breast cancer Neg Hx    Social History   Tobacco Use   Smoking status: Former Smoker    Packs/day: 1.00   Smokeless tobacco: Never Used  Substance Use Topics   Alcohol use: Yes    Alcohol/week: 1.0 standard drinks    Types: 1 Shots of liquor per week    Comment: occasionally     Current Outpatient Medications:    acetaminophen (TYLENOL) 500 MG tablet, Take 500 mg by mouth every 6 (six) hours as needed., Disp: , Rfl:    aspirin-acetaminophen-caffeine (EXCEDRIN MIGRAINE) 250-250-65 MG tablet, Take by mouth every  6 (six) hours as needed for headache., Disp: , Rfl:    cetirizine (ZYRTEC) 10 MG tablet, Take 10 mg by mouth daily., Disp: , Rfl:    cyclobenzaprine (FLEXERIL) 10 MG tablet, Take 1 tablet (10 mg total) by mouth 2 (two) times daily as needed for muscle spasms., Disp: 20 tablet, Rfl: 0   ibuprofen (ADVIL) 800 MG tablet, Take 1 tablet (800 mg total) by mouth every 8 (eight) hours as needed., Disp: 21 tablet, Rfl: 0   metroNIDAZOLE (FLAGYL) 500 MG tablet, Take 1 tablet (500 mg total) by mouth 2 (two) times daily., Disp: 14 tablet, Rfl: 0   naproxen  sodium (ALEVE) 220 MG tablet, Take 220 mg by mouth., Disp: , Rfl:    omeprazole (PRILOSEC) 20 MG capsule, TAKE 1 CAPSULE BY MOUTH EVERY DAY, Disp: 90 capsule, Rfl: 1   ondansetron (ZOFRAN ODT) 4 MG disintegrating tablet, Take 1 tablet (4 mg total) by mouth every 8 (eight) hours as needed for nausea or vomiting., Disp: 20 tablet, Rfl: 0  EXAM:  GENERAL: alert, oriented, appears well and in no acute distress  HEENT: atraumatic, conjunttiva clear, no obvious abnormalities on inspection of external nose and ears  NECK: normal movements of the head and neck  LUNGS: on inspection no signs of respiratory distress, breathing rate appears normal, no obvious gross SOB, gasping or wheezing  CV: no obvious cyanosis  MS: moves all visible extremities without noticeable abnormality  PSYCH/NEURO: pleasant and cooperative, no obvious depression or anxiety, speech and thought processing grossly intact  ASSESSMENT AND PLAN:  Discussed the following assessment and plan:  Migraine-patient's migraine headache has been difficult to break currently.  We will use Imitrex tablets to see if this works as abortive therapy.  Also advised to be sure to get plenty of sleep, keep up good fluid intake.  After taking Imitrex to take a nap in a dark quiet room to be sure we can break the migraine.  Advised if this strategy does not work we may need to refer to neurology for further evaluation.  Also advised if she starts to have headaches more frequently, we can consider preventative migraine therapies   I discussed the assessment and treatment plan with the patient. The patient was provided an opportunity to ask questions and all were answered. The patient agreed with the plan and demonstrated an understanding of the instructions.   The patient was advised to call back or seek an in-person evaluation if the symptoms worsen or if the condition fails to improve as anticipated.  Jodelle Green, FNP

## 2019-06-16 ENCOUNTER — Other Ambulatory Visit: Payer: Self-pay | Admitting: Family Medicine

## 2019-06-16 DIAGNOSIS — G43019 Migraine without aura, intractable, without status migrainosus: Secondary | ICD-10-CM

## 2019-07-18 ENCOUNTER — Other Ambulatory Visit: Payer: Self-pay

## 2019-07-18 DIAGNOSIS — Z20822 Contact with and (suspected) exposure to covid-19: Secondary | ICD-10-CM

## 2019-07-20 ENCOUNTER — Telehealth: Payer: 59 | Admitting: Physician Assistant

## 2019-07-20 DIAGNOSIS — B349 Viral infection, unspecified: Secondary | ICD-10-CM | POA: Diagnosis not present

## 2019-07-20 LAB — NOVEL CORONAVIRUS, NAA: SARS-CoV-2, NAA: NOT DETECTED

## 2019-07-20 NOTE — Progress Notes (Signed)
E-Visit for [employer] Virus Screening   Carolyn Chambers,   Given your responses it appears you may be in the early stages of a viral infection. Given your lack or other symptoms it may be too early to tell what direction this will go. Given the current COVID 19 pandemic your symptoms could be consistent with this. I have attache the following information for you to read.    Your current symptoms could be consistent with the coronavirus.  Many health care providers can now test patients at their office but not all are.  Carolyn Chambers has multiple testing sites. For information on our COVID testing locations and hours go to [email or web address]  Please quarantine yourself while awaiting your test results.  We are enrolling you in our [place] for [place] . Daily you will receive a questionnaire within the [employer] website. Our COVID 19 response team willl be monitoriing your responses daily. Please continue good preventive care measures, including:  frequent hand-washing, avoid touching your face, cover coughs/sneezes, stay out of crowds and keep a 6 foot distance from others.    COVID-19 is a respiratory illness with symptoms that are similar to the flu. Symptoms are typically mild to moderate, but there have been cases of severe illness and death due to the virus. The following symptoms may appear 2-14 days after exposure: . Fever . Cough . Shortness of breath or difficulty breathing . Chills . Repeated shaking with chills . Muscle pain . Headache . Sore throat . New loss of taste or smell . Fatigue . Congestion or runny nose . Nausea or vomiting . Diarrhea  If you develop fever/cough/breathlessness, please stay home for 10 days with improving symptoms and until you have had 24 hours of no fever (without taking a fever reducer).  Go to the nearest hospital ED for assessment if fever/cough/breathlessness are severe or illness seems like a threat to life.  It is  vitally important that if you feel that you have an infection such as this virus or any other virus that you stay home and away from places where you may spread it to others.  You should avoid contact with people age 86 and older.   You should wear a mask or cloth face covering over your nose and mouth if you must be around other people or animals, including pets (even at home). Try to stay at least 6 feet away from other people. This will protect the people around you.    You may also take acetaminophen (Tylenol) as needed for fever.   Reduce your risk of any infection by using the same precautions used for avoiding the common cold or flu:  Marland Kitchen Wash your hands often with soap and warm water for at least 20 seconds.  If soap and water are not readily available, use an alcohol-based hand sanitizer with at least 60% alcohol.  . If coughing or sneezing, cover your mouth and nose by coughing or sneezing into the elbow areas of your shirt or coat, into a tissue or into your sleeve (not your hands). . Avoid shaking hands with others and consider head nods or verbal greetings only. . Avoid touching your eyes, nose, or mouth with unwashed hands.  . Avoid close contact with people who are sick. . Avoid places or events with large numbers of people in one location, like concerts or sporting events. . Carefully consider travel plans you have or are making. . If you are planning any travel outside or inside  the [place], visit the CDC's Travelers' Health webpage for the latest health notices. . If you have some symptoms but not all symptoms, continue to monitor at home and seek medical attention if your symptoms worsen. . If you are having a medical emergency, call 911.  HOME CARE . Only take medications as instructed by your medical team. . Drink plenty of fluids and get plenty of rest. . A steam or ultrasonic humidifier can help if you have congestion.   GET HELP RIGHT AWAY IF YOU HAVE EMERGENCY WARNING  SIGNS** FOR COVID-19. If you or someone is showing any of these signs seek emergency medical care immediately. Call 911 or proceed to your closest emergency facility if: . You develop worsening high fever. . Trouble breathing . Bluish lips or face . Persistent pain or pressure in the chest . New confusion . Inability to wake or stay awake . You cough up blood. . Your symptoms become more severe  **This list is not all possible symptoms. Contact your medical provider for any symptoms that are sever or concerning to you.   MAKE SURE YOU   Understand these instructions.  Will watch your condition.  Will get help right away if you are not doing well or get worse.  Your e-visit answers were reviewed by a board certified advanced clinical practitioner to complete your personal care plan.  Depending on the condition, your plan could have included both over the counter or prescription medications.  If there is a problem please reply once you have received a response from your provider.  Your safety is important to [place].  If you have drug allergies check your prescription carefully.    You can use MyChart to ask questions about today's visit, request a non-urgent call back, or ask for a work or school excuse for 24 hours related to this e-Visit. If it has been greater than 24 hours you will need to follow up with your provider, or enter a new e-Visit to address those concerns. You will get an e-mail in the next two days asking about your experience.  I hope that your e-visit has been valuable and will speed your recovery. Thank you for using e-visits.   Greater than 5 minutes, yet less than 10 minutes of time have been spent researching, coordinating, and implementing care for this patient today

## 2019-08-18 ENCOUNTER — Ambulatory Visit: Payer: 59 | Admitting: Family

## 2019-11-30 DIAGNOSIS — Z01419 Encounter for gynecological examination (general) (routine) without abnormal findings: Secondary | ICD-10-CM | POA: Diagnosis not present

## 2019-11-30 DIAGNOSIS — Z113 Encounter for screening for infections with a predominantly sexual mode of transmission: Secondary | ICD-10-CM | POA: Diagnosis not present

## 2019-11-30 DIAGNOSIS — R69 Illness, unspecified: Secondary | ICD-10-CM | POA: Diagnosis not present

## 2019-11-30 DIAGNOSIS — Z124 Encounter for screening for malignant neoplasm of cervix: Secondary | ICD-10-CM | POA: Diagnosis not present

## 2019-12-27 DIAGNOSIS — J302 Other seasonal allergic rhinitis: Secondary | ICD-10-CM | POA: Diagnosis not present

## 2019-12-27 DIAGNOSIS — J029 Acute pharyngitis, unspecified: Secondary | ICD-10-CM | POA: Diagnosis not present

## 2020-01-19 ENCOUNTER — Other Ambulatory Visit: Payer: Self-pay | Admitting: Family

## 2020-01-19 ENCOUNTER — Other Ambulatory Visit: Payer: Self-pay

## 2020-01-19 ENCOUNTER — Encounter: Payer: Self-pay | Admitting: Family

## 2020-01-19 DIAGNOSIS — M545 Low back pain, unspecified: Secondary | ICD-10-CM

## 2020-01-19 MED ORDER — CYCLOBENZAPRINE HCL 10 MG PO TABS: 10.0000 mg | ORAL_TABLET | Freq: Two times a day (BID) | ORAL | 0 refills | Status: DC | PRN

## 2020-03-26 DIAGNOSIS — G43009 Migraine without aura, not intractable, without status migrainosus: Secondary | ICD-10-CM | POA: Insufficient documentation

## 2020-03-26 DIAGNOSIS — Z8739 Personal history of other diseases of the musculoskeletal system and connective tissue: Secondary | ICD-10-CM | POA: Insufficient documentation

## 2020-03-26 DIAGNOSIS — Z299 Encounter for prophylactic measures, unspecified: Secondary | ICD-10-CM | POA: Diagnosis not present

## 2020-03-26 DIAGNOSIS — L219 Seborrheic dermatitis, unspecified: Secondary | ICD-10-CM | POA: Insufficient documentation

## 2020-03-26 DIAGNOSIS — T63481A Toxic effect of venom of other arthropod, accidental (unintentional), initial encounter: Secondary | ICD-10-CM | POA: Diagnosis not present

## 2020-03-26 DIAGNOSIS — J3089 Other allergic rhinitis: Secondary | ICD-10-CM | POA: Insufficient documentation

## 2020-03-28 ENCOUNTER — Ambulatory Visit: Payer: 59 | Admitting: Dermatology

## 2020-03-28 ENCOUNTER — Other Ambulatory Visit: Payer: Self-pay

## 2020-03-28 DIAGNOSIS — L219 Seborrheic dermatitis, unspecified: Secondary | ICD-10-CM | POA: Diagnosis not present

## 2020-03-28 DIAGNOSIS — L570 Actinic keratosis: Secondary | ICD-10-CM

## 2020-03-28 DIAGNOSIS — L719 Rosacea, unspecified: Secondary | ICD-10-CM

## 2020-03-28 DIAGNOSIS — L821 Other seborrheic keratosis: Secondary | ICD-10-CM | POA: Diagnosis not present

## 2020-03-28 DIAGNOSIS — Z1283 Encounter for screening for malignant neoplasm of skin: Secondary | ICD-10-CM | POA: Diagnosis not present

## 2020-03-28 DIAGNOSIS — D229 Melanocytic nevi, unspecified: Secondary | ICD-10-CM | POA: Diagnosis not present

## 2020-03-28 DIAGNOSIS — L578 Other skin changes due to chronic exposure to nonionizing radiation: Secondary | ICD-10-CM

## 2020-03-28 DIAGNOSIS — Z872 Personal history of diseases of the skin and subcutaneous tissue: Secondary | ICD-10-CM

## 2020-03-28 DIAGNOSIS — I781 Nevus, non-neoplastic: Secondary | ICD-10-CM

## 2020-03-28 DIAGNOSIS — L813 Cafe au lait spots: Secondary | ICD-10-CM

## 2020-03-28 DIAGNOSIS — L814 Other melanin hyperpigmentation: Secondary | ICD-10-CM

## 2020-03-28 MED ORDER — CLOBETASOL PROPIONATE 0.05 % EX SHAM: MEDICATED_SHAMPOO | CUTANEOUS | 5 refills | Status: DC

## 2020-03-28 MED ORDER — KETOCONAZOLE 2 % EX SHAM: MEDICATED_SHAMPOO | CUTANEOUS | 5 refills | Status: DC

## 2020-03-28 NOTE — Progress Notes (Signed)
   Follow-Up Visit   Subjective  Carolyn Chambers is a 39 y.o. female who presents for the following: Annual Exam. The patient presents for Total-Body Skin Exam (TBSE) for skin cancer screening and mole check. Patient here today for TBSE. She has a history of AK at right nasal bridge. She has noticed some dark spots on her cheeks but nothing else new or changing patient is aware of. She is being treated for seb derm of the scalp with ketoconazole 2% shampoo, alternating with clobetasol shampoo and T-Sal.   The following portions of the chart were reviewed this encounter and updated as appropriate:  Tobacco  Allergies  Meds  Problems  Med Hx  Surg Hx  Fam Hx     Review of Systems:  No other skin or systemic complaints except as noted in HPI or Assessment and Plan.  Objective  Well appearing patient in no apparent distress; mood and affect are within normal limits.  A full examination was performed including scalp, head, eyes, ears, nose, lips, neck, chest, axillae, abdomen, back, buttocks, bilateral upper extremities, bilateral lower extremities, hands, feet, fingers, toes, fingernails, and toenails. All findings within normal limits unless otherwise noted below.  Objective  Nose: clear  Objective  Face: Pinkness on cheeks and nose  Objective  Mid Dorsum of Nose x 1: Erythematous thin papules/macules with gritty scale.    Assessment & Plan  History of actinic keratosis Nose  No evidence of recurrence, call clinic for new or changing lesions.   Rosacea Face Mild.  No treatment.  Seborrheic dermatitis Scalp Persistent. Cont ketoconazole 2% shampoo alternating with clobetasol shampoo and T-Sal  Ordered Medications: Clobetasol Propionate 0.05 % shampoo  Reordered Medications ketoconazole (NIZORAL) 2 % shampoo  AK (actinic keratosis) Mid Dorsum of Nose x 1  Destruction of lesion - Mid Dorsum of Nose x 1 Complexity: simple   Destruction method: cryotherapy     Informed consent: discussed and consent obtained   Timeout:  patient name, date of birth, surgical site, and procedure verified Lesion destroyed using liquid nitrogen: Yes   Region frozen until ice ball extended beyond lesion: Yes   Outcome: patient tolerated procedure well with no complications   Post-procedure details: wound care instructions given    Lentigines - Scattered tan macules - Discussed due to sun exposure - Benign, observe - Call for any changes  Seborrheic Keratoses - Stuck-on, waxy, tan-brown papules and plaques  - Discussed benign etiology and prognosis. - Observe - Call for any changes  Melanocytic Nevi - Tan-brown and/or pink-flesh-colored symmetric macules and papules - Benign appearing on exam today - Observation - Call clinic for new or changing moles - Recommend daily use of broad spectrum spf 30+ sunscreen to sun-exposed areas.   Actinic Damage - diffuse scaly erythematous macules with underlying dyspigmentation - Recommend daily broad spectrum sunscreen SPF 30+ to sun-exposed areas, reapply every 2 hours as needed.  - Call for new or changing lesions.  Skin cancer screening performed today.  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  Graciella Belton, RMA, am acting as scribe for Sarina Ser, MD . Documentation: I have reviewed the above documentation for accuracy and completeness, and I agree with the above.  Sarina Ser, MD

## 2020-03-28 NOTE — Patient Instructions (Addendum)
Melanoma ABCDEs  Melanoma is the most dangerous type of skin cancer, and is the leading cause of death from skin disease.  You are more likely to develop melanoma if you:  Have light-colored skin, light-colored eyes, or red or blond hair  Spend a lot of time in the sun  Tan regularly, either outdoors or in a tanning bed  Have had blistering sunburns, especially during childhood  Have a close family member who has had a melanoma  Have atypical moles or large birthmarks  Early detection of melanoma is key since treatment is typically straightforward and cure rates are extremely high if we catch it early.   The first sign of melanoma is often a change in a mole or a new dark spot.  The ABCDE system is a way of remembering the signs of melanoma.  A for asymmetry:  The two halves do not match. B for border:  The edges of the growth are irregular. C for color:  A mixture of colors are present instead of an even brown color. D for diameter:  Melanomas are usually (but not always) greater than 6mm - the size of a pencil eraser. E for evolution:  The spot keeps changing in size, shape, and color.  Please check your skin once per month between visits. You can use a small mirror in front and a large mirror behind you to keep an eye on the back side or your body.   If you see any new or changing lesions before your next follow-up, please call to schedule a visit.  Please continue daily skin protection including broad spectrum sunscreen SPF 30+ to sun-exposed areas, reapplying every 2 hours as needed when you're outdoors.    Cryotherapy Aftercare  . Wash gently with soap and water everyday.   . Apply Vaseline and Band-Aid daily until healed.   Seborrheic Keratosis  What causes seborrheic keratoses? Seborrheic keratoses are harmless, common skin growths that first appear during adult life.  As time goes by, more growths appear.  Some people may develop a large number of them.  Seborrheic  keratoses appear on both covered and uncovered body parts.  They are not caused by sunlight.  The tendency to develop seborrheic keratoses can be inherited.  They vary in color from skin-colored to gray, brown, or even black.  They can be either smooth or have a rough, warty surface.   Seborrheic keratoses are superficial and look as if they were stuck on the skin.  Under the microscope this type of keratosis looks like layers upon layers of skin.  That is why at times the top layer may seem to fall off, but the rest of the growth remains and re-grows.    Treatment Seborrheic keratoses do not need to be treated, but can easily be removed in the office.  Seborrheic keratoses often cause symptoms when they rub on clothing or jewelry.  Lesions can be in the way of shaving.  If they become inflamed, they can cause itching, soreness, or burning.  Removal of a seborrheic keratosis can be accomplished by freezing, burning, or surgery. If any spot bleeds, scabs, or grows rapidly, please return to have it checked, as these can be an indication of a skin cancer.  

## 2020-03-29 ENCOUNTER — Encounter: Payer: Self-pay | Admitting: Family

## 2020-03-29 ENCOUNTER — Ambulatory Visit: Payer: 59 | Admitting: Family

## 2020-03-29 DIAGNOSIS — K6289 Other specified diseases of anus and rectum: Secondary | ICD-10-CM | POA: Diagnosis not present

## 2020-03-29 NOTE — Patient Instructions (Signed)
Please let me know if you develop any new symptoms including pain, discharge, bleeding.  As we discussed, we will go ahead and consult gastroenterology.  Let us know if you dont hear back within a week in regards to an appointment being scheduled.   Stay safe!

## 2020-03-29 NOTE — Progress Notes (Signed)
Subjective:    Patient ID: Carolyn Chambers, female    DOB: [birth year], 39 y.o.   MRN: [id number]  CC: Carolyn Chambers is a 39 y.o. female who presents today for an acute visit.    HPI: Complains of 'lump' in rectum x 2 couple weeks, unchanged.  Nontender. No itching.  No changes to bowel habits, dysuria, changes in vaginal discharge. No straining , constipation, rectal bleeding, pain with bowel movement.   LMP started today  She is sexuallly active, no concerns for STDs    HISTORY:  Past Medical History:  Diagnosis Date  . Dysmenorrhea   . [id number](272.5)    Past Surgical History:  Procedure Laterality Date  . TUBAL LIGATION  2005   Family History  Problem Relation Age of Onset  . Hypertension Father   . ADD / ADHD Brother   . Emphysema Maternal Grandmother   . Cancer Maternal Grandfather 42       colon  . Alcohol abuse Maternal Grandfather   . Liver cancer Maternal Grandfather   . Heart attack Paternal Grandfather   . Diabetes Paternal Grandfather   . Hypertension Paternal Grandfather   . Skin cancer Mother   . Breast cancer Neg Hx     Allergies: Patient has no known allergies. Current Outpatient Medications on File Prior to Visit  Medication Sig Dispense Refill  . acetaminophen (TYLENOL) 500 MG tablet Take 500 mg by mouth every 6 (six) hours as needed.    Marland Kitchen aspirin-acetaminophen-caffeine (EXCEDRIN MIGRAINE) 250-250-65 MG tablet Take by mouth every 6 (six) hours as needed for headache.    . cephALEXin (KEFLEX) 500 MG capsule Take 500 mg by mouth 3 (three) times daily.    . cetirizine (ZYRTEC) 10 MG tablet Take 10 mg by mouth daily.    . Clobetasol Propionate 0.05 % shampoo clobetasol 0.05 % shampoo  TWICE A WEEK LATHER ON SCALP, LEAVE ON 8 10 MINUTES, RINSE WELL    . Clobetasol Propionate 0.05 % shampoo Apply to scalp and let sit a few minutes before rinsing alternating with ketoconazole and T-Sal 118 mL 5  . cyclobenzaprine (FLEXERIL) 10 MG tablet Take 1 tablet (10 mg  total) by mouth 2 (two) times daily as needed for muscle spasms. 20 tablet 0  . fexofenadine (ALLEGRA) 60 MG tablet Take by mouth.    Marland Kitchen ibuprofen (ADVIL) 800 MG tablet Take 1 tablet (800 mg total) by mouth every 8 (eight) hours as needed. 21 tablet 0  . ketoconazole (NIZORAL) 2 % shampoo Apply to scalp 3 times weekly, leave in for 5-10 minutes before rinsing alternating with clobetasol and T-Sal. 120 mL 5  . methylPREDNISolone (MEDROL DOSEPAK) 4 MG TBPK tablet See admin instructions.    Marland Kitchen omeprazole (PRILOSEC) 20 MG capsule TAKE 1 CAPSULE BY MOUTH EVERY DAY 90 capsule 1  . ondansetron (ZOFRAN ODT) 4 MG disintegrating tablet Take 1 tablet (4 mg total) by mouth every 8 (eight) hours as needed for nausea or vomiting. 20 tablet 0  . predniSONE (STERAPRED UNI-PAK 21 TAB) 10 MG (21) TBPK tablet Take by mouth.    . SUMAtriptan (IMITREX) 50 MG tablet TAKE 1 TABLET (50 MG TOTAL) BY MOUTH EVERY 2 (TWO) HOURS AS NEEDED FOR MIGRAINE. MAY REPEAT IN 2 HOURS IF HEADACHE PERSISTS OR RECURS. MAX 4 TAB IN ONE DAY. 10 tablet 0   No current facility-administered medications on file prior to visit.    Social History   Tobacco Use  . Smoking status: Former Smoker  Packs/day: 1.00  . Smokeless tobacco: Never Used  Substance Use Topics  . Alcohol use: Yes    Alcohol/week: 1.0 standard drink    Types: 1 Shots of liquor per week    Comment: occasionally  . Drug use: No    Review of Systems  Constitutional: Negative for chills and fever.  Respiratory: Negative for cough.   Cardiovascular: Negative for chest pain and palpitations.  Gastrointestinal: Negative for abdominal distention, abdominal pain, anal bleeding, blood in stool, constipation, nausea and vomiting.  Genitourinary: Negative for difficulty urinating and dysuria.      Objective:    BP (!) 98/60 (BP Location: Left Arm, Patient Position: Sitting)   Pulse 87   Temp 98.4 F (36.9 C)   Ht 5' 1.73" (1.568 m)   Wt 132 lb 3.2 oz (60 kg)   SpO2  97%   BMI 24.39 kg/m    Physical Exam Vitals reviewed.  Constitutional:      Appearance: She is well-developed.  Eyes:     Conjunctiva/sclera: Conjunctivae normal.  Cardiovascular:     Rate and Rhythm: Normal rate and regular rhythm.     Pulses: Normal pulses.     Heart sounds: Normal heart sounds.  Pulmonary:     Effort: Pulmonary effort is normal.     Breath sounds: Normal breath sounds. No wheezing, rhonchi or rales.  Genitourinary:   Skin:    General: Skin is warm and dry.  Neurological:     Mental Status: She is alert.  Psychiatric:        Speech: Speech normal.        Behavior: Behavior normal.        Thought Content: Thought content normal.        Assessment & Plan:    Problem List Items Addressed This Visit      Other   Rectal mass    Fortunately I was unable to appreciate any asymmetry or mass with a rectal exam today.  I discussed with patient this is not a typical complaint that I see and I did not feel comfortable without a second opinion to ensure there is no further evaluation needed.  Patient and I jointly agreed we would pursue a consult with gastroenterology to ensure repeat digital exam and/or anoscope is not needed.  Patient does not have significant history of colon cancer in first-degree relative.  Will follow.      Relevant Orders   Ambulatory referral to Gastroenterology       I have discontinued Carolyn Chambers's metroNIDAZOLE, naproxen sodium, and norethindrone-ethinyl estradiol. I am also having her maintain her cetirizine, omeprazole, ondansetron, aspirin-acetaminophen-caffeine, acetaminophen, ibuprofen, SUMAtriptan, cyclobenzaprine, cephALEXin, fexofenadine, methylPREDNISolone, Clobetasol Propionate, ketoconazole, Clobetasol Propionate, and predniSONE.   No orders of the defined types were placed in this encounter.   Return precautions given.   Risks, benefits, and alternatives of the medications and treatment plan prescribed today were  discussed, and patient expressed understanding.   Education regarding symptom management and diagnosis given to patient on AVS.  Continue to follow with Burnard Hawthorne, FNP for routine health maintenance.   Carolyn Chambers and I agreed with plan.   Mable Paris, FNP

## 2020-04-02 ENCOUNTER — Encounter: Payer: Self-pay | Admitting: Dermatology

## 2020-04-02 ENCOUNTER — Encounter: Payer: Self-pay | Admitting: Family

## 2020-04-02 NOTE — Assessment & Plan Note (Signed)
Fortunately I was unable to appreciate any asymmetry or mass with a rectal exam today.  I discussed with patient this is not a typical complaint that I see and I did not feel comfortable without a second opinion to ensure there is no further evaluation needed.  Patient and I jointly agreed we would pursue a consult with gastroenterology to ensure repeat digital exam and/or anoscope is not needed.  Patient does not have significant history of colon cancer in first-degree relative.  Will follow.

## 2020-04-04 ENCOUNTER — Encounter: Payer: Self-pay | Admitting: Nurse Practitioner

## 2020-04-04 ENCOUNTER — Other Ambulatory Visit: Payer: Self-pay | Admitting: Nurse Practitioner

## 2020-04-04 ENCOUNTER — Ambulatory Visit: Payer: 59 | Admitting: Nurse Practitioner

## 2020-04-04 ENCOUNTER — Telehealth: Payer: Self-pay | Admitting: Nurse Practitioner

## 2020-04-04 VITALS — BP 124/70 | HR 92 | Ht 62.0 in | Wt 130.0 lb

## 2020-04-04 DIAGNOSIS — K648 Other hemorrhoids: Secondary | ICD-10-CM | POA: Diagnosis not present

## 2020-04-04 NOTE — Patient Instructions (Signed)
Take Metamucil daily as directed  Use OTC Hydrocortisone cream 2.5 % twice daily in the morning and at bedtime for 10 days  If you are age 39 or older, your body mass index should be between 23-30. Your Body mass index is 23.78 kg/m. If this is out of the aforementioned range listed, please consider follow up with your Primary Care Provider.  If you are age 108 or younger, your body mass index should be between 19-25. Your Body mass index is 23.78 kg/m. If this is out of the aformentioned range listed, please consider follow up with your Primary Care Provider.    I appreciate the  opportunity to care for you  Thank You   [place]

## 2020-04-04 NOTE — Telephone Encounter (Signed)
Pt is requesting a call back regarding her hydrocortisone medciation, pt needs some assistance on what she needs to purchase

## 2020-04-04 NOTE — Telephone Encounter (Signed)
Spoke with Patient and she was unable to find any hydrocortisone creams that were 2.5 % she said that all the creams she seen were all 1 %. Do you want something sent instead?

## 2020-04-04 NOTE — Progress Notes (Signed)
ASSESSMENT / PLAN:    Carolyn Chambers is a 39 y.o. female with PMH / [place] significant for,  but not necessarily limited to migraines and GERD on PPI    # Internal hemorrhoid. Probably the "lump" she is feeling inside when wiping.  --Hydrocortisone cream twice daily x10 days --Though she does not have problems with constipation I still think that trial of Metamucil is reasonable given presence of hemorrhoids --Follow-up with me in clinic in a few weeks. If symptoms persist may need to repeat anoscopy to see if internal hemorrhoid still present. If so then consider banding.     HPI:     Chief Complaint:  Lump in rectum   Carolyn Chambers is a 39 year old female referred by PCP.  Several weeks ago patient felt what is lump inside of her rectum when wiping.  She feels like the lump has been enlarging.  She has no history of hemorrhoids.  There has not been any associated rectal pain.  No bowel changes.  No straining.  No blood in her stool.  No other GI complaints. Only family history of colon cancer is that in a maternal grandfather in his eighties.  Patient saw PCP who was unable to appreciate any mass/asymmetry on rectal exam but felt GI evaluation was good idea.   Past Medical History:  Diagnosis Date  . Dysmenorrhea   . Migraines      Past Surgical History:  Procedure Laterality Date  . TUBAL LIGATION  2005   Family History  Problem Relation Age of Onset  . Hypertension Father   . ADD / ADHD Brother   . Emphysema Maternal Grandmother   . Alcohol abuse Maternal Grandfather   . Liver cancer Maternal Grandfather   . Colon cancer Maternal Grandfather 23  . Colon polyps Maternal Grandfather   . Heart attack Paternal Grandfather   . Diabetes Paternal Grandfather   . Hypertension Paternal Grandfather   . Skin cancer Mother   . Breast cancer Neg Hx    Social History   Tobacco Use  . Smoking status: Former Smoker    Packs/day: 1.00    Years: 20.00    Pack years:  20.00    Types: Cigarettes  . Smokeless tobacco: Never Used  . Tobacco comment: quit 09/01/2012  Vaping Use  . Vaping Use: Never used  Substance Use Topics  . Alcohol use: Yes    Alcohol/week: 1.0 standard drink    Types: 1 Shots of liquor per week    Comment: occasionally  . Drug use: No   Current Outpatient Medications  Medication Sig Dispense Refill  . acetaminophen (TYLENOL) 500 MG tablet Take 500 mg by mouth every 6 (six) hours as needed.    Marland Kitchen aspirin-acetaminophen-caffeine (EXCEDRIN MIGRAINE) 250-250-65 MG tablet Take by mouth every 6 (six) hours as needed for headache.    . cephALEXin (KEFLEX) 500 MG capsule Take 500 mg by mouth 3 (three) times daily.    . cetirizine (ZYRTEC) 10 MG tablet Take 10 mg by mouth daily as needed.     . Clobetasol Propionate 0.05 % shampoo clobetasol 0.05 % shampoo  TWICE A WEEK LATHER ON SCALP, LEAVE ON 8 10 MINUTES, RINSE WELL    . Clobetasol Propionate 0.05 % shampoo Apply to scalp and let sit a few minutes before rinsing alternating with ketoconazole and T-Sal 118 mL 5  . cyclobenzaprine (FLEXERIL) 10 MG tablet Take 1 tablet (10 mg total)  by mouth 2 (two) times daily as needed for muscle spasms. 20 tablet 0  . ibuprofen (ADVIL) 800 MG tablet Take 1 tablet (800 mg total) by mouth every 8 (eight) hours as needed. 21 tablet 0  . ketoconazole (NIZORAL) 2 % shampoo Apply to scalp 3 times weekly, leave in for 5-10 minutes before rinsing alternating with clobetasol and T-Sal. 120 mL 5  . methylPREDNISolone (MEDROL DOSEPAK) 4 MG TBPK tablet See admin instructions.    Marland Kitchen omeprazole (PRILOSEC) 20 MG capsule TAKE 1 CAPSULE BY MOUTH EVERY DAY 90 capsule 1  . ondansetron (ZOFRAN ODT) 4 MG disintegrating tablet Take 1 tablet (4 mg total) by mouth every 8 (eight) hours as needed for nausea or vomiting. 20 tablet 0  . predniSONE (STERAPRED UNI-PAK 21 TAB) 10 MG (21) TBPK tablet Take by mouth.    . SUMAtriptan (IMITREX) 50 MG tablet TAKE 1 TABLET (50 MG TOTAL) BY MOUTH  EVERY 2 (TWO) HOURS AS NEEDED FOR MIGRAINE. MAY REPEAT IN 2 HOURS IF HEADACHE PERSISTS OR RECURS. MAX 4 TAB IN ONE DAY. 10 tablet 0  . fexofenadine (ALLEGRA) 60 MG tablet Take by mouth.     No current facility-administered medications for this visit.   No Known Allergies   Review of Systems: Positive for fatigue, headaches, menstrual pain and urine leakage  All other systems reviewed and negative except where noted in HPI.   Creatinine clearance cannot be calculated (Patient's most recent lab result is older than the maximum 21 days allowed.)   Physical Exam:    Wt Readings from Last 3 Encounters:  04/04/20 130 lb (59 kg)  03/29/20 132 lb 3.2 oz (60 kg)  11/02/18 149 lb 3.2 oz (67.7 kg)    BP 124/70   Pulse 92   Ht 5\' 2"  (1.575 m)   Wt 130 lb (59 kg)   SpO2 99%   BMI 23.78 kg/m  Constitutional:  Pleasant female in no acute distress. Psychiatric: Normal mood and affect. Behavior is normal. EENT: Pupils normal.  Conjunctivae are normal. No scleral icterus. Neck supple.  Cardiovascular: Normal rate, regular rhythm. No edema Pulmonary/chest: Effort normal and breath sounds normal. No wheezing, rales or rhonchi. Abdominal: Soft, nondistended, nontender. Bowel sounds active throughout. There are no masses palpable. No hepatomegaly. Rectal: no external lesions.  No masses on digital exam.  On anoscopic view there was an inflamed posterior hemorrhoid.  Neurological: Alert and oriented to person place and time. Skin: Skin is warm and dry. No rashes noted.  Tye Savoy, NP  04/04/2020, 5:10 PM  Cc:  Referring Provider Arnett, Yvetta Coder, FNP

## 2020-04-04 NOTE — Telephone Encounter (Signed)
It is a prescription ( generic anusol) 2.5% BID inside rectum x 10 days  Was it not called in today at time of our clinic ?  If not, please send a Rx. Thanks

## 2020-04-05 MED ORDER — HYDROCORTISONE (PERIANAL) 2.5 % EX CREA: 1.0000 "application " | TOPICAL_CREAM | Freq: Two times a day (BID) | CUTANEOUS | 0 refills | Status: DC

## 2020-04-05 NOTE — Telephone Encounter (Signed)
Patient has been advised that a script has been sent to her pharmacy.

## 2020-04-07 NOTE — Progress Notes (Signed)
Attending Physician's Attestation   I have reviewed the chart.   I agree with the Advanced Practitioner's note, impression, and recommendations with any updates as below.  Depending on patient's repeat evaluation and anoscopy will consider role of colonoscopy in future.  Justice Britain, MD [facility] Advanced Endoscopy Office # [phone]

## 2020-04-15 DIAGNOSIS — R519 Headache, unspecified: Secondary | ICD-10-CM | POA: Diagnosis not present

## 2020-04-15 DIAGNOSIS — Z20822 Contact with and (suspected) exposure to covid-19: Secondary | ICD-10-CM | POA: Diagnosis not present

## 2020-04-18 ENCOUNTER — Other Ambulatory Visit: Payer: Self-pay

## 2020-04-18 ENCOUNTER — Encounter: Payer: Self-pay | Admitting: Nurse Practitioner

## 2020-04-18 ENCOUNTER — Telehealth (INDEPENDENT_AMBULATORY_CARE_PROVIDER_SITE_OTHER): Payer: 59 | Admitting: Nurse Practitioner

## 2020-04-18 VITALS — Temp 98.4°F | Ht 62.0 in | Wt 130.0 lb

## 2020-04-18 DIAGNOSIS — U071 COVID-19: Secondary | ICD-10-CM

## 2020-04-18 MED ORDER — DEXTROMETHORPHAN-GUAIFENESIN 5-100 MG/5ML PO LIQD: 10.0000 mL | Freq: Every evening | ORAL | 0 refills | Status: AC | PRN

## 2020-04-18 NOTE — Progress Notes (Addendum)
Virtual Visit via Video Note  This visit type was conducted due to national recommendations for restrictions regarding the COVID-19 pandemic (e.g. social distancing).  This format is felt to be most appropriate for this patient at this time.  All issues noted in this document were discussed and addressed.  No physical exam was performed (except for noted visual exam findings with Video Visits).   I connected with@ on 04/18/20 at  3:00 PM EDT by a video enabled telemedicine application or telephone and verified that I am speaking with the correct person using two identifiers. Location patient: home Location provider: work or home office Persons participating in the virtual visit: patient, provider  I discussed the limitations, risks, security and privacy concerns of performing an evaluation and management service by telephone and the availability of in person appointments. I also discussed with the patient that there may be a patient responsible charge related to this service. The patient expressed understanding and agreed to proceed.   Reason for visit: Covid positive and symptoms  HPI: This 39 yo reports direct Covid exposure on Wed-04/10/20. That night she had a HA and it was much better the next day and she felt fine. On Sunday, 8/15 she became really sick all at once with body aches, 101.T max, intermittent fever/chills, fatigue, constant  nasal congestion, coughing- phelgm -not green. Covid test at Urgent Care PCR  negative on Mon. She took another Covid Rapid test last night that was positive.  She has  Covid and is breathing fine. No SOB at rest or with exertion.Cough is present , but not bad. She feels really weak . Her biggest complaints  is HA and body aches. She has taken DayQuil, Nyquil, Motrin, Excedrin migraine.  No hx of asthma or chronic Wt is 130 lbs.She is eating and drinking without N/V or abd pain.   ROS: See pertinent positives and negatives per HPI.  Past Medical History:   Diagnosis Date  . Dysmenorrhea   . Migraines     Past Surgical History:  Procedure Laterality Date  . TUBAL LIGATION  2005    Family History  Problem Relation Age of Onset  . Hypertension Father   . ADD / ADHD Brother   . Emphysema Maternal Grandmother   . Alcohol abuse Maternal Grandfather   . Liver cancer Maternal Grandfather   . Colon cancer Maternal Grandfather 63  . Colon polyps Maternal Grandfather   . Heart attack Paternal Grandfather   . Diabetes Paternal Grandfather   . Hypertension Paternal Grandfather   . Skin cancer Mother   . Breast cancer Neg Hx     SOCIAL OV:FIEPPI smoker- quit 7 yrs ago   Current Outpatient Medications:  .  acetaminophen (TYLENOL) 500 MG tablet, Take 500 mg by mouth every 6 (six) hours as needed., Disp: , Rfl:  .  aspirin-acetaminophen-caffeine (EXCEDRIN MIGRAINE) 250-250-65 MG tablet, Take by mouth every 6 (six) hours as needed for headache., Disp: , Rfl:  .  cetirizine (ZYRTEC) 10 MG tablet, Take 10 mg by mouth daily as needed. , Disp: , Rfl:  .  Clobetasol Propionate 0.05 % shampoo, Apply to scalp and let sit a few minutes before rinsing alternating with ketoconazole and T-Sal, Disp: 118 mL, Rfl: 5 .  cyclobenzaprine (FLEXERIL) 10 MG tablet, Take 1 tablet (10 mg total) by mouth 2 (two) times daily as needed for muscle spasms., Disp: 20 tablet, Rfl: 0 .  ketoconazole (NIZORAL) 2 % shampoo, Apply to scalp 3 times weekly, leave in  for 5-10 minutes before rinsing alternating with clobetasol and T-Sal., Disp: 120 mL, Rfl: 5 .  omeprazole (PRILOSEC) 20 MG capsule, TAKE 1 CAPSULE BY MOUTH EVERY DAY, Disp: 90 capsule, Rfl: 1 .  ondansetron (ZOFRAN ODT) 4 MG disintegrating tablet, Take 1 tablet (4 mg total) by mouth every 8 (eight) hours as needed for nausea or vomiting., Disp: 20 tablet, Rfl: 0 .  SUMAtriptan (IMITREX) 50 MG tablet, TAKE 1 TABLET (50 MG TOTAL) BY MOUTH EVERY 2 (TWO) HOURS AS NEEDED FOR MIGRAINE. MAY REPEAT IN 2 HOURS IF HEADACHE  PERSISTS OR RECURS. MAX 4 TAB IN ONE DAY., Disp: 10 tablet, Rfl: 0 .  Dextromethorphan-guaiFENesin 5-100 MG/5ML LIQD, Take 10 mLs by mouth at bedtime as needed for up to 7 days (for cough)., Disp: 70 mL, Rfl: 0  EXAM:  VITALS per patient if applicable:  GENERAL: alert, oriented, appears very fatigued and mildy ill, no acute distress  HEENT: atraumatic, conjunctiva clear, no obvious abnormalities on inspection of external nose and ears, lips look dry  NECK: normal movements of the head and neck  LUNGS: on inspection no signs of respiratory distress, breathing rate appears normal, no obvious gross SOB, gasping or wheezing. Slight, dry cough noted. No wheezing.  CV: no obvious cyanosis  MS: moves all visible extremities without noticeable abnormality  PSYCH/NEURO: pleasant and cooperative, no obvious depression or anxiety, speech and thought processing grossly intact  ASSESSMENT AND PLAN:  Discussed the following assessment and plan:  COVID-19 virus infection  No problem-specific Assessment & Plan notes found for this encounter. Zaharah present on video very fatigued  and with slight cough. Advised to hydrate well as lips look dry. She voices understanding.   Advised of CDC guidelines for self isolation/ ending isolation.  Advised of safe practice guidelines. Symptom Tier reviewed.  Encouraged to monitor for any worsening symptoms; watch for increased shortness of breath, weakness, and signs of dehydration. Advised when to seek emergency care.  Instructed to rest and hydrate well.  Advised to leave the house during recommended isolation period, only if it is necessary to seek medical care.  F/up on Monday or Tues with video visit.  I have ordered Mucinex DM to take at bedtime for cough.    Please begin Vit C 1000 mg daily  Vitamin D3 4000 IU daily  Zinc 100 g daily  Quercetin 250 mg -500 mg twice daily   Rest, hydrate very well, eat healthy protein food, Tylenol or Advil as  directed .   Please go to Acute Care if symptoms worsen but are not an emergency. Office video visit again early next week   are.  I discussed the assessment and treatment plan with the patient. The patient was provided an opportunity to ask questions and all were answered. The patient agreed with the plan and demonstrated an understanding of the instructions.   The patient was advised to call back or seek an in-person evaluation if the symptoms worsen or if the condition fails to improve as anticipated.  I provided 25 minutes of non-face-to-face time during this encounter.  Denice Paradise, NP Adult Nurse Practitioner [place] [phone]

## 2020-04-18 NOTE — Patient Instructions (Addendum)
I have ordered Mucinex DM to take at bedtime for cough.    Please begin Vit C 1000 mg daily  Vitamin D3 4000 IU daily  Zinc 100 g daily  Quercetin 250 mg -500 mg twice daily   Rest, hydrate very well, eat healthy protein food, Tylenol or Advil as directed .   Please go to Acute Care if symptoms worsen but are not an emergency. Office video visit again early next week.   Remain in self quarantine until at least 10 days since symptom onset AND 3 consecutive days fever free without antipyretics AND improvement in respiratory symptoms.   Utilize over the counter medications to treat symptoms.  Seek  treatment in the ED if respiratory issues/distress develops or unrelieved chest pain. Or, if you become severely weak, dehydrated, confused.   Only leave home to seek medical care and must wear a mask in public. Limit contact with family members or caregivers in the home and to notify her family who she was with yesterday that they should quarantine for 14 days. Practice social distancing and to continue to use good preventative care measures such has frequent hand washing.    Your COVID-19 test was resulted as positive.   You should continue your self imposed quantarine until you can answer "yes" to ALL 3 of the conditions below:   1) Your symptoms (fever, cough, shortness of breath) started 7 or more days ago   2) Your body temperature  has been normal for at least 72 hours (WITHOUT the use of any tylenol, motrin or aleve) . Normal is < 100.4 Farenheit  3) your other flu  like symptoms symptoms are getting better.   YOUR FAMILY or other household contacts, however , even if they feel fine , will need to continue to quarantining themselves  (that means no contact with ANYONE outside of the house)  for 14 days STARTING from the end of your initial 7 day period . (why? because they have been theoretically exposed to you during the entire 7 days of your illness, and they can sheD the virus  without symptoms for that period of time ).      10 Things You Can Do to Manage Your COVID-19 Symptoms at Home If you have possible or confirmed COVID-19: 1. Stay home from work and school. And stay away from other public places. If you must go out, avoid using any kind of public transportation, ridesharing, or taxis. 2. Monitor your symptoms carefully. If your symptoms get worse, call your healthcare provider immediately. 3. Get rest and stay hydrated. 4. If you have a medical appointment, call the healthcare provider ahead of time and tell them that you have or may have COVID-19. 5. For medical emergencies, call 911 and notify the dispatch personnel that you have or may have COVID-19. 6. Cover your cough and sneezes with a tissue or use the inside of your elbow. 7. Wash your hands often with soap and water for at least 20 seconds or clean your hands with an alcohol-based hand sanitizer that contains at least 60% alcohol. 8. As much as possible, stay in a specific room and away from other people in your home. Also, you should use a separate bathroom, if available. If you need to be around other people in or outside of the home, wear a mask. 9. Avoid sharing personal items with other people in your household, like dishes, towels, and bedding. 10. Clean all surfaces that are touched often, like counters, tabletops,  and doorknobs. Use household cleaning sprays or wipes according to the label instructions. [email or web address] 03/01/2019 This information is not intended to replace advice given to you by your health care provider. Make sure you discuss any questions you have with your health care provider. Document Revised: 08/03/2019 Document Reviewed: 08/03/2019 Elsevier Patient Education  [place].

## 2020-04-24 ENCOUNTER — Encounter: Payer: Self-pay | Admitting: Family

## 2020-04-24 ENCOUNTER — Telehealth (INDEPENDENT_AMBULATORY_CARE_PROVIDER_SITE_OTHER): Payer: 59 | Admitting: Family

## 2020-04-24 VITALS — Ht 62.0 in | Wt 128.0 lb

## 2020-04-24 DIAGNOSIS — U071 COVID-19: Secondary | ICD-10-CM

## 2020-04-24 MED ORDER — ALBUTEROL SULFATE HFA 108 (90 BASE) MCG/ACT IN AERS: 2.0000 | INHALATION_SPRAY | Freq: Four times a day (QID) | RESPIRATORY_TRACT | 0 refills | Status: DC | PRN

## 2020-04-24 MED ORDER — PREDNISONE 10 MG PO TABS: ORAL_TABLET | ORAL | 0 refills | Status: DC

## 2020-04-24 NOTE — Progress Notes (Signed)
Virtual Visit via Video Note  I connected with@  on 04/24/20 at  1:30 PM EDT by a video enabled telemedicine application and verified that I am speaking with the correct person using two identifiers.  Location patient: home Location provider:work Persons participating in the virtual visit: patient, provider  I discussed the limitations of evaluation and management by telemedicine and the availability of in person appointments. The patient expressed understanding and agreed to proceed.   HPI: Complains of occasional productive cough and fatigue persists x 2 weeks, unchanged.  Coughing most of day which is overall unchanged since onset of illness.  SOB with coughing 'fit'. No wheezing, sore throat, ear pain, sob, doe, cp, leg swelling, vision loss, cp. No pulse ox at home. Drinking water. Decreased appetite.   Had fever 'off and on', tmax 100F. Since resolved HA and myalgia are improved Using dayquil and cough drops with some relief.   Both of her sons are positive covid Covid exposure 04/10/20 Covid PCR negative at urgent care 04/15/20, which was negataive 04/17/20 rapid test at home positive  Taking vitamin c, vitamin d, and zinc.   No h/o asthma Has been on prednisone in the past, tolerates well.   ROS: See pertinent positives and negatives per HPI.  Past Medical History:  Diagnosis Date  . Dysmenorrhea   . Migraines     Past Surgical History:  Procedure Laterality Date  . TUBAL LIGATION  2005    Family History  Problem Relation Age of Onset  . Hypertension Father   . ADD / ADHD Brother   . Emphysema Maternal Grandmother   . Alcohol abuse Maternal Grandfather   . Liver cancer Maternal Grandfather   . Colon cancer Maternal Grandfather 73  . Colon polyps Maternal Grandfather   . Heart attack Paternal Grandfather   . Diabetes Paternal Grandfather   . Hypertension Paternal Grandfather   . Skin cancer Mother   . Breast cancer Neg Hx     SOCIAL HX: former  smoker   Current Outpatient Medications:  .  acetaminophen (TYLENOL) 500 MG tablet, Take 500 mg by mouth every 6 (six) hours as needed., Disp: , Rfl:  .  aspirin-acetaminophen-caffeine (EXCEDRIN MIGRAINE) 250-250-65 MG tablet, Take by mouth every 6 (six) hours as needed for headache., Disp: , Rfl:  .  cetirizine (ZYRTEC) 10 MG tablet, Take 10 mg by mouth daily as needed. , Disp: , Rfl:  .  Clobetasol Propionate 0.05 % shampoo, Apply to scalp and let sit a few minutes before rinsing alternating with ketoconazole and T-Sal, Disp: 118 mL, Rfl: 5 .  cyclobenzaprine (FLEXERIL) 10 MG tablet, Take 1 tablet (10 mg total) by mouth 2 (two) times daily as needed for muscle spasms., Disp: 20 tablet, Rfl: 0 .  Dextromethorphan-guaiFENesin 5-100 MG/5ML LIQD, Take 10 mLs by mouth at bedtime as needed for up to 7 days (for cough)., Disp: 70 mL, Rfl: 0 .  ketoconazole (NIZORAL) 2 % shampoo, Apply to scalp 3 times weekly, leave in for 5-10 minutes before rinsing alternating with clobetasol and T-Sal., Disp: 120 mL, Rfl: 5 .  omeprazole (PRILOSEC) 20 MG capsule, TAKE 1 CAPSULE BY MOUTH EVERY DAY, Disp: 90 capsule, Rfl: 1 .  ondansetron (ZOFRAN ODT) 4 MG disintegrating tablet, Take 1 tablet (4 mg total) by mouth every 8 (eight) hours as needed for nausea or vomiting., Disp: 20 tablet, Rfl: 0 .  SUMAtriptan (IMITREX) 50 MG tablet, TAKE 1 TABLET (50 MG TOTAL) BY MOUTH EVERY 2 (TWO) HOURS AS  NEEDED FOR MIGRAINE. MAY REPEAT IN 2 HOURS IF HEADACHE PERSISTS OR RECURS. MAX 4 TAB IN ONE DAY., Disp: 10 tablet, Rfl: 0 .  albuterol (VENTOLIN HFA) 108 (90 Base) MCG/ACT inhaler, Inhale 2 puffs into the lungs every 6 (six) hours as needed for wheezing or shortness of breath., Disp: 8 g, Rfl: 0 .  predniSONE (DELTASONE) 10 MG tablet, Take 40 mg by mouth on day 1, then taper 10 mg daily until gone, Disp: 10 tablet, Rfl: 0  EXAM:  VITALS per patient if applicable:  GENERAL: alert, oriented, appears well and in no acute  distress  HEENT: atraumatic, conjunttiva clear, no obvious abnormalities on inspection of external nose and ears  NECK: normal movements of the head and neck  LUNGS: on inspection no signs of respiratory distress, breathing rate appears normal, no obvious gross SOB, gasping or wheezing. Wet sounding cough occasional throughout video  CV: no obvious cyanosis  MS: moves all visible extremities without noticeable abnormality  PSYCH/NEURO: pleasant and cooperative, no obvious depression or anxiety, speech and thought processing grossly intact  ASSESSMENT AND PLAN:  Discussed the following assessment and plan:  COVID-19 virus infection - Plan: predniSONE (DELTASONE) 10 MG tablet, albuterol (VENTOLIN HFA) 108 (90 Base) MCG/ACT inhaler Problem List Items Addressed This Visit      Other   COVID-19 virus infection - Primary    No respiratory distress. She is not labored in speech. Based on duration of cough, will start prednisone, albuterol. Advised to continue close vigilence and call if she develops any SOB as well as to remain home, quarantined, unless cough resolved. She will let me know if cough doesn't improve as advised CXR.       Relevant Medications   predniSONE (DELTASONE) 10 MG tablet   albuterol (VENTOLIN HFA) 108 (90 Base) MCG/ACT inhaler      -we discussed possible serious and likely etiologies, options for evaluation and workup, limitations of telemedicine visit vs in person visit, treatment, treatment risks and precautions. Pt prefers to treat via telemedicine empirically rather then risking or undertaking an in person visit at this moment.  Work/School slipped offered: Advised to seek prompt follow up telemedicine visit or in person care if worsening, new symptoms arise, or if is not improving with treatment. Did let her know that I only do telemedicine on Tuesdays and Thursdays for [facility] and advised follow up visit with PCP or [facility] if needs follow up or if any further  questions arise to avoid any delays.   I discussed the assessment and treatment plan with the patient. The patient was provided an opportunity to ask questions and all were answered. The patient agreed with the plan and demonstrated an understanding of the instructions.   The patient was advised to call back or seek an in-person evaluation if the symptoms worsen or if the condition fails to improve as anticipated.   Mable Paris, FNP

## 2020-04-24 NOTE — Assessment & Plan Note (Addendum)
No respiratory distress. She is not labored in speech. Based on duration of cough, will start prednisone, albuterol. Advised to continue close vigilence and call if she develops any SOB as well as to remain home, quarantined, unless cough resolved. She will let me know if cough doesn't improve as advised CXR.

## 2020-04-29 NOTE — Telephone Encounter (Signed)
FMLA paper work was never received, please re-fax paper work.

## 2020-04-29 NOTE — Telephone Encounter (Signed)
I have faxed again to [employer].

## 2020-05-01 ENCOUNTER — Telehealth (INDEPENDENT_AMBULATORY_CARE_PROVIDER_SITE_OTHER): Payer: 59 | Admitting: Family

## 2020-05-01 ENCOUNTER — Encounter: Payer: Self-pay | Admitting: Family

## 2020-05-01 VITALS — Ht 62.0 in | Wt 130.0 lb

## 2020-05-01 DIAGNOSIS — U071 COVID-19: Secondary | ICD-10-CM | POA: Diagnosis not present

## 2020-05-01 MED ORDER — PROPRANOLOL HCL 10 MG PO TABS: 10.0000 mg | ORAL_TABLET | Freq: Two times a day (BID) | ORAL | 1 refills | Status: DC

## 2020-05-01 MED ORDER — AZITHROMYCIN 250 MG PO TABS: ORAL_TABLET | ORAL | 0 refills | Status: DC

## 2020-05-01 MED ORDER — BUDESONIDE-FORMOTEROL FUMARATE 80-4.5 MCG/ACT IN AERO: 2.0000 | INHALATION_SPRAY | Freq: Two times a day (BID) | RESPIRATORY_TRACT | 1 refills | Status: DC

## 2020-05-01 NOTE — Patient Instructions (Addendum)
Start propranolol for HA.   Symbicort for cough.   Please go to [facility] to have chest xray done when you can  Please let me know of any new symptoms, worsening symptoms and especially if headache and cough do not improve to resolve.   Please remain out of work and in quarantine until cough resolved.   Stay safe

## 2020-05-01 NOTE — Progress Notes (Signed)
Virtual Visit via Video Note  I connected with@  on 05/03/20 at  2:30 PM EDT by a video enabled telemedicine application and verified that I am speaking with the correct person using two identifiers.  Location patient: home Location provider:work  Persons participating in the virtual visit: patient, provider  I discussed the limitations of evaluation and management by telemedicine and the availability of in person appointments. The patient expressed understanding and agreed to proceed.   HPI:  Productive Cough continues for 2 weeks, unchanged,  describes coughing all day long. Albuterol has been helpful for coughing. No sob, wheezing, cp.  Completed prednisone which was helpful while on medication, however cough recurred after prednisone was completed.  No fever, sinus pressure, congestion.   Complains of frontal or occipital HA, comes and goes, for the last 2 weeks. No HA today. Typically occurs in late afternoon. Exacerbated by movement such as driving. HA associated nausea without vomiting. Typical migraine HA has photosensitivity, and frontal.  Not worse HA of life.  No neck pain, aura, vision changes or vision loss.   Eating and drinking well.  Not sleeping well, tosses and turns and trouble staying asleep.   H/o migraine. Took one dose of imitrex without relief. Some relief with excredin migraine however not taking daily.    ROS: See pertinent positives and negatives per HPI.  Past Medical History:  Diagnosis Date  . Dysmenorrhea   . Migraines     Past Surgical History:  Procedure Laterality Date  . TUBAL LIGATION  2005    Family History  Problem Relation Age of Onset  . Hypertension Father   . ADD / ADHD Brother   . Emphysema Maternal Grandmother   . Alcohol abuse Maternal Grandfather   . Liver cancer Maternal Grandfather   . Colon cancer Maternal Grandfather 78  . Colon polyps Maternal Grandfather   . Heart attack Paternal Grandfather   . Diabetes Paternal  Grandfather   . Hypertension Paternal Grandfather   . Skin cancer Mother   . Breast cancer Neg Hx       Current Outpatient Medications:  .  acetaminophen (TYLENOL) 500 MG tablet, Take 500 mg by mouth every 6 (six) hours as needed., Disp: , Rfl:  .  albuterol (VENTOLIN HFA) 108 (90 Base) MCG/ACT inhaler, Inhale 2 puffs into the lungs every 6 (six) hours as needed for wheezing or shortness of breath., Disp: 8 g, Rfl: 0 .  aspirin-acetaminophen-caffeine (EXCEDRIN MIGRAINE) 250-250-65 MG tablet, Take by mouth every 6 (six) hours as needed for headache., Disp: , Rfl:  .  cetirizine (ZYRTEC) 10 MG tablet, Take 10 mg by mouth daily as needed. , Disp: , Rfl:  .  Clobetasol Propionate 0.05 % shampoo, Apply to scalp and let sit a few minutes before rinsing alternating with ketoconazole and T-Sal, Disp: 118 mL, Rfl: 5 .  cyclobenzaprine (FLEXERIL) 10 MG tablet, Take 1 tablet (10 mg total) by mouth 2 (two) times daily as needed for muscle spasms., Disp: 20 tablet, Rfl: 0 .  ketoconazole (NIZORAL) 2 % shampoo, Apply to scalp 3 times weekly, leave in for 5-10 minutes before rinsing alternating with clobetasol and T-Sal., Disp: 120 mL, Rfl: 5 .  omeprazole (PRILOSEC) 20 MG capsule, TAKE 1 CAPSULE BY MOUTH EVERY DAY, Disp: 90 capsule, Rfl: 1 .  ondansetron (ZOFRAN ODT) 4 MG disintegrating tablet, Take 1 tablet (4 mg total) by mouth every 8 (eight) hours as needed for nausea or vomiting., Disp: 20 tablet, Rfl: 0 .  SUMAtriptan (IMITREX) 50 MG tablet, TAKE 1 TABLET (50 MG TOTAL) BY MOUTH EVERY 2 (TWO) HOURS AS NEEDED FOR MIGRAINE. MAY REPEAT IN 2 HOURS IF HEADACHE PERSISTS OR RECURS. MAX 4 TAB IN ONE DAY., Disp: 10 tablet, Rfl: 0 .  azithromycin (ZITHROMAX) 250 MG tablet, Tale 500 mg PO on day 1, then 250 mg PO q24h x 4 days., Disp: 6 tablet, Rfl: 0 .  budesonide-formoterol (SYMBICORT) 80-4.5 MCG/ACT inhaler, Inhale 2 puffs into the lungs 2 (two) times daily., Disp: 1 each, Rfl: 1 .  propranolol (INDERAL) 10 MG  tablet, Take 1 tablet (10 mg total) by mouth 2 (two) times daily., Disp: 30 tablet, Rfl: 1  EXAM:  VITALS per patient if applicable: BP Readings from Last 3 Encounters:  04/04/20 124/70  03/29/20 (!) 98/60  05/23/19 113/78     GENERAL: alert, oriented, appears well and in no acute distress  HEENT: atraumatic, conjunttiva clear, no obvious abnormalities on inspection of external nose and ears  NECK: normal movements of the head and neck  LUNGS: on inspection no signs of respiratory distress, breathing rate appears normal, no obvious gross SOB, gasping or wheezing  CV: no obvious cyanosis  MS: moves all visible extremities without noticeable abnormality  PSYCH/NEURO: pleasant and cooperative, no obvious depression or anxiety, speech and thought processing grossly intact  ASSESSMENT AND PLAN:  Discussed the following assessment and plan:  COVID-19 virus infection - Plan: budesonide-formoterol (SYMBICORT) 80-4.5 MCG/ACT inhaler, DG Chest 2 View, azithromycin (ZITHROMAX) 250 MG tablet, propranolol (INDERAL) 10 MG tablet Problem List Items Addressed This Visit      Other   COVID-19 virus infection - Primary    Well appearing and no acute respiratory distress. Improved on oral prednisone and agreed trial of symbicort appropriate. Based on duration of symptoms, I have also started zpak for concern of secondary bacterial infection. Pending CXR as well.  H/o migraine and I suspect that poor sleep due to covid and viral itself has triggered HA. Will start daily preventative propranolol with idea to wean off eventually was HA frequency returns to baseline. Advised patient to remain very vigilant and let me know of any new concerns, in particular if HA and cough do not improve to resolve. She will remain of work, quarantine until cough has significantly improved to near resolution.        Relevant Medications   budesonide-formoterol (SYMBICORT) 80-4.5 MCG/ACT inhaler   azithromycin  (ZITHROMAX) 250 MG tablet   propranolol (INDERAL) 10 MG tablet   Other Relevant Orders   DG Chest 2 View      -we discussed possible serious and likely etiologies, options for evaluation and workup, limitations of telemedicine visit vs in person visit, treatment, treatment risks and precautions. Pt prefers to treat via telemedicine empirically rather then risking or undertaking an in person visit at this moment.   I discussed the assessment and treatment plan with the patient. The patient was provided an opportunity to ask questions and all were answered. The patient agreed with the plan and demonstrated an understanding of the instructions.   The patient was advised to call back or seek an in-person evaluation if the symptoms worsen or if the condition fails to improve as anticipated.   Mable Paris, FNP

## 2020-05-03 NOTE — Assessment & Plan Note (Addendum)
Well appearing and no acute respiratory distress. Improved on oral prednisone and agreed trial of symbicort appropriate. Based on duration of symptoms, I have also started zpak for concern of secondary bacterial infection. Pending CXR as well.  H/o migraine and I suspect that poor sleep due to covid and viral itself has triggered HA. Will start daily preventative propranolol with idea to wean off eventually was HA frequency returns to baseline. Advised patient to remain very vigilant and let me know of any new concerns, in particular if HA and cough do not improve to resolve. She will remain of work, quarantine until cough has significantly improved to near resolution.

## 2020-05-08 ENCOUNTER — Ambulatory Visit
Admission: RE | Admit: 2020-05-08 | Discharge: 2020-05-08 | Disposition: A | Discharge status: Home / Self Care | Payer: 59 | Source: Ambulatory Visit | Attending: Family | Admitting: Family

## 2020-05-08 ENCOUNTER — Ambulatory Visit
Admission: RE | Admit: 2020-05-08 | Discharge: 2020-05-08 | Disposition: A | Discharge status: Home / Self Care | Payer: 59 | Attending: Family | Admitting: Family

## 2020-05-08 DIAGNOSIS — R05 Cough: Secondary | ICD-10-CM | POA: Diagnosis not present

## 2020-05-08 DIAGNOSIS — U071 COVID-19: Secondary | ICD-10-CM | POA: Diagnosis not present

## 2020-05-15 ENCOUNTER — Encounter: Payer: Self-pay | Admitting: Family

## 2020-05-17 ENCOUNTER — Other Ambulatory Visit: Payer: Self-pay | Admitting: Family

## 2020-05-17 DIAGNOSIS — U071 COVID-19: Secondary | ICD-10-CM

## 2020-05-21 ENCOUNTER — Encounter: Payer: Self-pay | Admitting: Nurse Practitioner

## 2020-05-21 ENCOUNTER — Ambulatory Visit: Payer: 59 | Admitting: Nurse Practitioner

## 2020-05-21 VITALS — BP 102/60 | HR 71 | Ht 62.0 in | Wt 126.5 lb

## 2020-05-21 DIAGNOSIS — K648 Other hemorrhoids: Secondary | ICD-10-CM

## 2020-05-21 MED ORDER — HYDROCORTISONE (PERIANAL) 2.5 % EX CREA: 1.0000 "application " | TOPICAL_CREAM | Freq: Two times a day (BID) | CUTANEOUS | 0 refills | Status: AC

## 2020-05-21 NOTE — Progress Notes (Signed)
Attending Physician's Attestation   I have reviewed the chart.   I agree with the Advanced Practitioner's note, impression, and recommendations with any updates as below.    Emersyn Wyss Mansouraty, MD [facility] Office # [phone]  

## 2020-05-21 NOTE — Progress Notes (Signed)
     ASSESSMENT AND PLAN    # Internal hemorrhoids. --feels internal "lumps" and minor discomfort when wiping.. No bleeding  --She got COVID last month didn't feel like following through with the fiber nor complete the course of steroid cream prescribed. Now feels like she has two internal hemorrhoids from all the coughing.  --Will hold off on repeat DRE / anoscopy until fully treated. Will call in another Rx of Anusol BID PR for 10 days.  --Fiber, she prefers the capsules.  --Follow up with me in 2-3 weeks. Further recommendations pending clinical course.   HISTORY OF PRESENT ILLNESS     Primary Gastroenterologist : Justice Britain, MD  Chief Complaint : follow up on hemorrhoids  Carolyn Chambers is a 39 y.o. healthy female who I recently saw for complaints of a "lump" in rectum felt when wiping. On anoscopy she has an internal hemorrhoid. She was started on fiber and anusol cream.    Patient and her family had Covid last month. She developed a bad cough and now feels like she has two internal hemorrhoids from the coughing . Didn't start the fiber as advised but she drinks a lot of water throughout the day.  Mild rectal discomfort but only when wiping. She used the topical steroid cream inside the rectum for a week but then use was spotty after that. Having a BM every morning. Stools soft. No rectal bleeding.    Past Medical History:  Diagnosis Date  . Dysmenorrhea   . Migraines     Current Medications, Allergies, Past Surgical History, Family History and Social History were reviewed in [employer] record.   Review of Systems: No chest pain. No shortness of breath. No urinary complaints.   PHYSICAL EXAM :    Wt Readings from Last 3 Encounters:  05/01/20 130 lb (59 kg)  04/24/20 128 lb (58.1 kg)  04/18/20 130 lb (59 kg)    There were no vitals taken for this visit. Constitutional:  Pleasant female in no acute distress. Psychiatric: Normal mood and  affect. Behavior is normal. Neurological: Alert and oriented to person place and time.   Tye Savoy, NP  05/21/2020, 3:11 PM

## 2020-05-21 NOTE — Patient Instructions (Signed)
If you are age 39 or older, your body mass index should be between 23-30. Your Body mass index is 23.14 kg/m. If this is out of the aforementioned range listed, please consider follow up with your Primary Care Provider.  If you are age 4 or younger, your body mass index should be between 19-25. Your Body mass index is 23.14 kg/m. If this is out of the aformentioned range listed, please consider follow up with your Primary Care Provider.    We have sent the following medications to your pharmacy for you to pick up at your convenience: anusol cream  1 fiber capsule daily  Due to recent changes in healthcare laws, you may see the results of your imaging and laboratory studies on MyChart before your provider has had a chance to review them.  We understand that in some cases there may be results that are confusing or concerning to you. Not all laboratory results come back in the same time frame and the provider may be waiting for multiple results in order to interpret others.  Please give [place] 48 hours in order for your provider to thoroughly review all the results before contacting the office for clarification of your results.

## 2020-06-12 ENCOUNTER — Other Ambulatory Visit: Payer: Self-pay

## 2020-06-12 ENCOUNTER — Telehealth (INDEPENDENT_AMBULATORY_CARE_PROVIDER_SITE_OTHER): Payer: 59 | Admitting: Family

## 2020-06-12 ENCOUNTER — Encounter: Payer: Self-pay | Admitting: Family

## 2020-06-12 DIAGNOSIS — G43009 Migraine without aura, not intractable, without status migrainosus: Secondary | ICD-10-CM

## 2020-06-12 DIAGNOSIS — U071 COVID-19: Secondary | ICD-10-CM | POA: Diagnosis not present

## 2020-06-12 NOTE — Progress Notes (Signed)
Virtual Visit via Video Note  I connected with@  on 06/12/20 at  8:00 AM EDT by a video enabled telemedicine application and verified that I am speaking with the correct person using two identifiers.  Location patient: home Location provider:work  Persons participating in the virtual visit: patient, provider  I discussed the limitations of evaluation and management by telemedicine and the availability of in person appointments. The patient expressed understanding and agreed to proceed.   HPI: Follow up  No new concerns  Today Feels well. Cough has largely resolved. Every once and a while will have dry cough. She is back to work now. Energy improved. No sob, cp. HA resolved since starting propranolol 10mg  BID. hasnt had to use excredrin , imitrex. Pleased with medication.  H/o migraine Covid positive 04/15/20 CXR no active cardiopulmonary disease  Pap 2020 abnormal; due for repeat  ROS: See pertinent positives and negatives per HPI.    EXAM:  VITALS per patient if applicable: Ht 5\' 2"  (1.575 m)   Wt 126 lb (57.2 kg)   BMI 23.05 kg/m  BP Readings from Last 3 Encounters:  05/21/20 102/60  04/04/20 124/70  03/29/20 (!) 98/60   Wt Readings from Last 3 Encounters:  06/12/20 126 lb (57.2 kg)  05/21/20 126 lb 8 oz (57.4 kg)  05/01/20 130 lb (59 kg)    GENERAL: alert, oriented, appears well and in no acute distress  HEENT: atraumatic, conjunttiva clear, no obvious abnormalities on inspection of external nose and ears  NECK: normal movements of the head and neck  LUNGS: on inspection no signs of respiratory distress, breathing rate appears normal, no obvious gross SOB, gasping or wheezing  CV: no obvious cyanosis  MS: moves all visible extremities without noticeable abnormality  PSYCH/NEURO: pleasant and cooperative, no obvious depression or anxiety, speech and thought processing grossly intact  ASSESSMENT AND PLAN:  Discussed the following assessment and  plan:  Problem List Items Addressed This Visit      Cardiovascular and Mediastinum   Migraine without aura    Resolved. Pleased to hear she has improvement on propranolol 10mg  bid, will continue.        Other   COVID-19 virus infection    Symptoms largely resolved. She will let me know if cough were to worsen or not completely resolve.          -we discussed possible serious and likely etiologies, options for evaluation and workup, limitations of telemedicine visit vs in person visit, treatment, treatment risks and precautions. Pt prefers to treat via telemedicine empirically rather then risking or undertaking an in person visit at this moment.  .   I discussed the assessment and treatment plan with the patient. The patient was provided an opportunity to ask questions and all were answered. The patient agreed with the plan and demonstrated an understanding of the instructions.   The patient was advised to call back or seek an in-person evaluation if the symptoms worsen or if the condition fails to improve as anticipated.   Mable Paris, FNP

## 2020-06-12 NOTE — Assessment & Plan Note (Signed)
Resolved. Pleased to hear she has improvement on propranolol 10mg  bid, will continue.

## 2020-06-12 NOTE — Assessment & Plan Note (Signed)
Symptoms largely resolved. She will let me know if cough were to worsen or not completely resolve.

## 2020-06-14 ENCOUNTER — Telehealth: Payer: Self-pay

## 2020-06-14 NOTE — Telephone Encounter (Signed)
lvm to schedule 3 month physical with pap smear

## 2020-06-20 ENCOUNTER — Encounter: Payer: Self-pay | Admitting: Physician Assistant

## 2020-06-20 ENCOUNTER — Ambulatory Visit: Payer: 59 | Admitting: Physician Assistant

## 2020-06-20 VITALS — BP 100/78 | HR 84 | Ht 62.0 in | Wt 125.5 lb

## 2020-06-20 DIAGNOSIS — K648 Other hemorrhoids: Secondary | ICD-10-CM | POA: Diagnosis not present

## 2020-06-20 MED ORDER — HYDROCORTISONE (PERIANAL) 2.5 % EX CREA: 1.0000 "application " | TOPICAL_CREAM | Freq: Two times a day (BID) | CUTANEOUS | 2 refills | Status: DC

## 2020-06-20 NOTE — Progress Notes (Signed)
Attending Physician's Attestation   I have reviewed the chart.   I agree with the Advanced Practitioner's note, impression, and recommendations with any updates as below. Glad to hear that she is doing better.  If in the future she has any recurrence or worsening of symptoms, can restart Anusol but would recommend full colonoscopy at that time as well to exclude other issues.  If patient has any evidence of bleeding per rectum then should also consider diagnostic colonoscopy.   Justice Britain, MD [facility]oscopy Office # [phone]

## 2020-06-20 NOTE — Progress Notes (Signed)
Subjective:    Patient ID: Carolyn Chambers, female    DOB: 10-26-1980, 39 y.o.   MRN: [id number]  HPI  Carolyn Chambers is a pleasant 39 year old white female, established with Dr. Rush Landmark who comes in today for follow-up of internal hemorrhoids.  She had been seen by Tye Savoy, NP on 05/21/2020, and also in August 2021 for same.  At anoscopy in August 2020 when she was noted to have 1 inflamed internal hemorrhoid posteriorly. She was advised to use Anusol HC cream twice daily and start on a fiber supplement. When she returned in September unfortunately her course had been complicated by [id number], and she got off schedule with treatment for the hemorrhoids.  She feels that the internal hemorrhoids were aggravated by her frequent coughing. She has now been back on fiber supplement daily/Benefiber, and has been using the Anusol HC cream regularly.  She feels that the hemorrhoid has settled down.  She says she has never had any pain but it had some vague discomfort with bowel movements.  It sounds as if she had  mild prolapse with bowel movements and was able to feel a bump.  She says she is not having any current symptoms, bowel movements have been regular and she has not had any bleeding at any point in time. She has been eating high-fiber and drinking a lot of water.  Family history pertinent for maternal grandfather with colon cancer, no first-degree relatives.  Review of Systems Pertinent positive and negative review of systems were noted in the above HPI section.  All other review of systems was otherwise negative.  Outpatient Encounter Medications as of 06/20/2020  Medication Sig  . acetaminophen (TYLENOL) 500 MG tablet Take 500 mg by mouth every 6 (six) hours as needed.  Marland Kitchen albuterol (VENTOLIN HFA) 108 (90 Base) MCG/ACT inhaler TAKE 2 PUFFS BY MOUTH EVERY 6 HOURS AS NEEDED FOR WHEEZE OR SHORTNESS OF BREATH  . aspirin-acetaminophen-caffeine (EXCEDRIN MIGRAINE) 250-250-65 MG tablet Take by mouth every  6 (six) hours as needed for headache.  . budesonide-formoterol (SYMBICORT) 80-4.5 MCG/ACT inhaler Inhale 2 puffs into the lungs 2 (two) times daily.  . cetirizine (ZYRTEC) 10 MG tablet Take 10 mg by mouth daily as needed.   . Clobetasol Propionate 0.05 % shampoo Apply to scalp and let sit a few minutes before rinsing alternating with ketoconazole and T-Sal  . cyclobenzaprine (FLEXERIL) 10 MG tablet Take 1 tablet (10 mg total) by mouth 2 (two) times daily as needed for muscle spasms.  Marland Kitchen FIBER PO Take 1 capsule by mouth daily.  . hydrocortisone (ANUSOL-HC) 2.5 % rectal cream Place 1 application rectally 2 (two) times daily.  Marland Kitchen ketoconazole (NIZORAL) 2 % shampoo Apply to scalp 3 times weekly, leave in for 5-10 minutes before rinsing alternating with clobetasol and T-Sal.  . omeprazole (PRILOSEC) 20 MG capsule TAKE 1 CAPSULE BY MOUTH EVERY DAY  . propranolol (INDERAL) 10 MG tablet Take 1 tablet (10 mg total) by mouth 2 (two) times daily.  . SUMAtriptan (IMITREX) 50 MG tablet TAKE 1 TABLET (50 MG TOTAL) BY MOUTH EVERY 2 (TWO) HOURS AS NEEDED FOR MIGRAINE. MAY REPEAT IN 2 HOURS IF HEADACHE PERSISTS OR RECURS. MAX 4 TAB IN ONE DAY.  . [DISCONTINUED] hydrocortisone (ANUSOL-HC) 2.5 % rectal cream Place 1 application rectally 2 (two) times daily.   No facility-administered encounter medications on file as of 06/20/2020.   No Known Allergies Patient Active Problem List   Diagnosis Date Noted  . COVID-19 virus infection 04/18/2020  .  Rectal mass 03/29/2020  . History of muscle spasm 03/26/2020  . Migraine without aura 03/26/2020  . Perennial allergic rhinitis with seasonal variation 03/26/2020  . Seborrheic dermatitis of scalp 03/26/2020  . Routine physical examination 11/02/2018  . Lipoma 11/02/2018  . Vaginal discharge 11/02/2018  . Epigastric pain 10/05/2018  . [place] and nonspecific skin eruption 09/22/2016  . Acute bilateral low back pain 09/22/2016  . Tension headache 05/01/2016  . Muscle  tension headache 05/01/2016  . Preventative health care 07/12/2015   Social History   Socioeconomic History  . Marital status: Legally Separated    Spouse name: Not on file  . Number of children: 2  . Years of education: Not on file  . Highest education level: Not on file  Occupational History  . Occupation: Haematologist  Tobacco Use  . Smoking status: Former Smoker    Packs/day: 1.00    Years: 20.00    Pack years: 20.00    Types: Cigarettes  . Smokeless tobacco: Never Used  . Tobacco comment: quit 09/01/2012  Vaping Use  . Vaping Use: Never used  Substance and Sexual Activity  . Alcohol use: Yes    Alcohol/week: 1.0 standard drink    Types: 1 Shots of liquor per week    Comment: occasionally  . Drug use: No  . Sexual activity: Yes    Partners: Male    Birth control/protection: Surgical  Other Topics Concern  . Not on file  Social History Narrative   10 and 13 yo kids.    Social Determinants of Health   Financial Resource Strain:   . Difficulty of Paying Living Expenses: Not on file  Food Insecurity:   . Worried About Charity fundraiser in the Last Year: Not on file  . Ran Out of Food in the Last Year: Not on file  Transportation Needs:   . Lack of Transportation (Medical): Not on file  . Lack of Transportation (Non-Medical): Not on file  Physical Activity:   . Days of Exercise per Week: Not on file  . Minutes of Exercise per Session: Not on file  Stress:   . Feeling of Stress : Not on file  Social Connections:   . Frequency of Communication with Friends and Family: Not on file  . Frequency of Social Gatherings with Friends and Family: Not on file  . Attends Religious Services: Not on file  . Active Member of Clubs or Organizations: Not on file  . Attends Archivist Meetings: Not on file  . Marital Status: Not on file  Intimate Partner Violence:   . Fear of Current or Ex-Partner: Not on file  . Emotionally Abused: Not on file  .  Physically Abused: Not on file  . Sexually Abused: Not on file    Ms. Mikles's family history includes ADD / ADHD in her brother; Alcohol abuse in her maternal grandfather; Colon cancer (age of onset: 53) in her maternal grandfather; Colon polyps in her maternal grandfather; Diabetes in her paternal grandfather; Emphysema in her maternal grandmother; Heart attack in her paternal grandfather; Hypertension in her father and paternal grandfather; Liver cancer in her maternal grandfather; Skin cancer in her mother.      Objective:    Vitals:   06/20/20 1500  BP: 100/78  Pulse: 84  SpO2: 99%    Physical Exam Well-developed well-nourished female in no acute distress.  Height, Weight, BMI  HEENT; nontraumatic normocephalic, EOMI, PE R LA, sclera anicteric. Discussion only  today       Assessment & Plan:   #76 39 year old white female with symptomatic internal hemorrhoid, symptoms improved/resolved.  Plan; continue daily fiber supplement/Benefiber. She can stop regular use of Anusol HC cream and advised she resume as needed for recurrent symptoms.  We discussed indications for hemorrhoidal banding, do not feel that she needs to undergo hemorrhoidal banding at this time. She will return to the office on an as-needed basis and was also advised she can call for problems. Plan screening colonoscopy age 48.  Derk Doubek Genia Harold PA-C 06/20/2020   Cc: Burnard Hawthorne, FNP

## 2020-06-20 NOTE — Patient Instructions (Addendum)
If you are age 39 or older, your body mass index should be between 23-30. Your Body mass index is 22.95 kg/m. If this is out of the aforementioned range listed, please consider follow up with your Primary Care Provider.  If you are age 20 or younger, your body mass index should be between 19-25. Your Body mass index is 22.95 kg/m. If this is out of the aformentioned range listed, please consider follow up with your Primary Care Provider.   Continue Anusol Cream as needed for Hemorrhoid symptoms. Refills have been sent to your pharmacy.   Follow up as needed with Tye Savoy, NP

## 2020-08-05 ENCOUNTER — Encounter: Payer: 59 | Admitting: Family

## 2020-09-30 ENCOUNTER — Ambulatory Visit (INDEPENDENT_AMBULATORY_CARE_PROVIDER_SITE_OTHER): Payer: 59 | Admitting: Family

## 2020-09-30 ENCOUNTER — Encounter: Payer: 59 | Admitting: Family

## 2020-09-30 ENCOUNTER — Telehealth: Payer: Self-pay | Admitting: Family

## 2020-09-30 ENCOUNTER — Encounter: Payer: Self-pay | Admitting: Family

## 2020-09-30 ENCOUNTER — Other Ambulatory Visit (HOSPITAL_COMMUNITY)
Admission: RE | Admit: 2020-09-30 | Discharge: 2020-09-30 | Disposition: A | Discharge status: Home / Self Care | Payer: 59 | Source: Ambulatory Visit | Attending: Family | Admitting: Family

## 2020-09-30 ENCOUNTER — Other Ambulatory Visit: Payer: Self-pay

## 2020-09-30 VITALS — BP 118/70 | HR 92 | Temp 98.1°F | Ht 62.0 in | Wt 125.8 lb

## 2020-09-30 DIAGNOSIS — R1013 Epigastric pain: Secondary | ICD-10-CM

## 2020-09-30 DIAGNOSIS — G43009 Migraine without aura, not intractable, without status migrainosus: Secondary | ICD-10-CM | POA: Diagnosis not present

## 2020-09-30 DIAGNOSIS — M545 Low back pain, unspecified: Secondary | ICD-10-CM | POA: Diagnosis not present

## 2020-09-30 DIAGNOSIS — Z0000 Encounter for general adult medical examination without abnormal findings: Secondary | ICD-10-CM

## 2020-09-30 DIAGNOSIS — N920 Excessive and frequent menstruation with regular cycle: Secondary | ICD-10-CM | POA: Insufficient documentation

## 2020-09-30 LAB — CBC WITH DIFFERENTIAL/PLATELET
Basophils Absolute: 0 10*3/uL (ref 0.0–0.1)
Basophils Relative: 0.3 % (ref 0.0–3.0)
Eosinophils Absolute: 0 10*3/uL (ref 0.0–0.7)
Eosinophils Relative: 0.1 % (ref 0.0–5.0)
HCT: 41.1 % (ref 36.0–46.0)
Hemoglobin: 13.8 g/dL (ref 12.0–15.0)
Lymphocytes Relative: 11.1 % — ABNORMAL LOW (ref 12.0–46.0)
Lymphs Abs: 1.3 10*3/uL (ref 0.7–4.0)
MCHC: 33.6 g/dL (ref 30.0–36.0)
MCV: 92.1 fl (ref 78.0–100.0)
Monocytes Absolute: 0.5 10*3/uL (ref 0.1–1.0)
Monocytes Relative: 3.9 % (ref 3.0–12.0)
Neutro Abs: 10.2 10*3/uL — ABNORMAL HIGH (ref 1.4–7.7)
Neutrophils Relative %: 84.6 % — ABNORMAL HIGH (ref 43.0–77.0)
Platelets: 242 10*3/uL (ref 150.0–400.0)
RBC: 4.47 Mil/uL (ref 3.87–5.11)
RDW: 13.2 % (ref 11.5–15.5)
WBC: 12 10*3/uL — ABNORMAL HIGH (ref 4.0–10.5)

## 2020-09-30 LAB — LIPID PANEL
Cholesterol: 189 mg/dL (ref 0–200)
HDL: 47.8 mg/dL (ref >39.00)
LDL Cholesterol: 128 mg/dL — ABNORMAL HIGH (ref 0–99)
NonHDL: 141.61
Total CHOL/HDL Ratio: 4
Triglycerides: 67 mg/dL (ref 0.0–149.0)
VLDL: 13.4 mg/dL (ref 0.0–40.0)

## 2020-09-30 LAB — COMPREHENSIVE METABOLIC PANEL
ALT: 15 U/L (ref 0–35)
AST: 16 U/L (ref 0–37)
Albumin: 4.4 g/dL (ref 3.5–5.2)
Alkaline Phosphatase: 63 U/L (ref 39–117)
BUN: 10 mg/dL (ref 6–23)
CO2: 28 mEq/L (ref 19–32)
Calcium: 9.2 mg/dL (ref 8.4–10.5)
Chloride: 104 mEq/L (ref 96–112)
Creatinine, Ser: 0.62 mg/dL (ref 0.40–1.20)
GFR: 112.05 mL/min (ref >60.00)
Glucose, Bld: 106 mg/dL — ABNORMAL HIGH (ref 70–99)
Potassium: 4.3 mEq/L (ref 3.5–5.1)
Sodium: 138 mEq/L (ref 135–145)
Total Bilirubin: 0.6 mg/dL (ref 0.2–1.2)
Total Protein: 6.9 g/dL (ref 6.0–8.3)

## 2020-09-30 LAB — TSH: TSH: 1.03 u[IU]/mL (ref 0.35–4.50)

## 2020-09-30 LAB — VITAMIN D 25 HYDROXY (VIT D DEFICIENCY, FRACTURES): VITD: 21.3 ng/mL — ABNORMAL LOW (ref 30.00–100.00)

## 2020-09-30 LAB — HEMOGLOBIN A1C: Hgb A1c MFr Bld: 5.6 % (ref 4.6–6.5)

## 2020-09-30 MED ORDER — CYCLOBENZAPRINE HCL 10 MG PO TABS: 10.0000 mg | ORAL_TABLET | Freq: Two times a day (BID) | ORAL | 2 refills | Status: AC | PRN

## 2020-09-30 MED ORDER — OMEPRAZOLE 20 MG PO CPDR: DELAYED_RELEASE_CAPSULE | ORAL | 1 refills | Status: AC

## 2020-09-30 NOTE — Progress Notes (Signed)
Subjective:    Patient ID: Carolyn Chambers, female    DOB: [birth year], 40 y.o.   MRN: [id number]  CC: Carolyn Chambers is a 40 y.o. female who presents today for physical exam .    HPI: She complains of regular menses with very heavy blood flow and pain for some time now. She has discussed ablation versus hysterectomy with Dr Ouida Sills however she is not sure that is done having children. No dysuria, dyspareunia, abdominal bloating.   Migraine without aura- She has stopped taking  propranolol 10mg  BID as HA's have been well controlled. Very rare use imitrex 50mg  prn. HA are similar to HA's in the past. HA's has decreased from 1-3 per month to once per month.   She is no longer needing an inhaler.   Follows with Dermatology   Takes flexeril prn for low back pain.  Would like refill of omeprazole for as needed as well.     Colorectal Cancer Screening: no early family colon cancer Breast Cancer Screening: Due in August at age 63  Cervical Cancer Screening: due H/o tubal ligation. Follows with Dr Freda Munro, OB GYN [facility] [employer].         Tetanus -UTD Hepatitis C screening - Candidate for, consents Labs: Screening labs today. Exercise: Gets regular exercise, walking.  No cp, sob.   Alcohol use:  occassional Smoking/tobacco use: former smoker.     HISTORY:  Past Medical History:  Diagnosis Date  . Dysmenorrhea   . Migraines     Past Surgical History:  Procedure Laterality Date  . TUBAL LIGATION  2005   Family History  Problem Relation Age of Onset  . Hypertension Father   . ADD / ADHD Brother   . Emphysema Maternal Grandmother   . Alcohol abuse Maternal Grandfather   . Liver cancer Maternal Grandfather   . Colon cancer Maternal Grandfather 60  . Colon polyps Maternal Grandfather   . Heart attack Paternal Grandfather   . Diabetes Paternal Grandfather   . Hypertension Paternal Grandfather   . Skin cancer Mother   . Breast cancer Neg Hx   . Esophageal  cancer Neg Hx   . Pancreatic cancer Neg Hx   . Stomach cancer Neg Hx       ALLERGIES: Patient has no known allergies.  Current Outpatient Medications on File Prior to Visit  Medication Sig Dispense Refill  . acetaminophen (TYLENOL) 500 MG tablet Take 500 mg by mouth every 6 (six) hours as needed.    Marland Kitchen aspirin-acetaminophen-caffeine (EXCEDRIN MIGRAINE) 250-250-65 MG tablet Take by mouth every 6 (six) hours as needed for headache.    . cetirizine (ZYRTEC) 10 MG tablet Take 10 mg by mouth daily as needed.     . Clobetasol Propionate 0.05 % shampoo Apply to scalp and let sit a few minutes before rinsing alternating with ketoconazole and T-Sal 118 mL 5  . FIBER PO Take 1 capsule by mouth daily.    Marland Kitchen ketoconazole (NIZORAL) 2 % shampoo Apply to scalp 3 times weekly, leave in for 5-10 minutes before rinsing alternating with clobetasol and T-Sal. 120 mL 5  . SUMAtriptan (IMITREX) 50 MG tablet TAKE 1 TABLET (50 MG TOTAL) BY MOUTH EVERY 2 (TWO) HOURS AS NEEDED FOR MIGRAINE. MAY REPEAT IN 2 HOURS IF HEADACHE PERSISTS OR RECURS. MAX 4 TAB IN ONE DAY. 10 tablet 0  . hydrocortisone (ANUSOL-HC) 2.5 % rectal cream Place 1 application rectally 2 (two) times daily. (Patient not taking: Reported on 09/30/2020) 30  g 2  . propranolol (INDERAL) 10 MG tablet Take 1 tablet (10 mg total) by mouth 2 (two) times daily. (Patient not taking: Reported on 09/30/2020) 30 tablet 1   No current facility-administered medications on file prior to visit.    Social History   Tobacco Use  . Smoking status: Former Smoker    Packs/day: 1.00    Years: 20.00    Pack years: 20.00    Types: Cigarettes  . Smokeless tobacco: Never Used  . Tobacco comment: quit 09/01/2012  Vaping Use  . Vaping Use: Never used  Substance Use Topics  . Alcohol use: Yes    Alcohol/week: 1.0 standard drink    Types: 1 Shots of liquor per week    Comment: occasionally  . Drug use: No    Review of Systems  Constitutional: Negative for chills,  fever and unexpected weight change.  HENT: Negative for congestion.   Respiratory: Negative for cough.   Cardiovascular: Negative for chest pain, palpitations and leg swelling.  Gastrointestinal: Negative for abdominal distention, abdominal pain, anal bleeding, blood in stool, nausea and vomiting.  Genitourinary: Positive for menstrual problem, pelvic pain (with menses) and vaginal bleeding. Negative for dysuria and vaginal pain.  Musculoskeletal: Positive for back pain (chronic, low back pain, intermittent). Negative for arthralgias and myalgias.  Skin: Negative for [place].  Neurological: Negative for headaches.  Hematological: Negative for adenopathy.  Psychiatric/Behavioral: Negative for confusion.      Objective:    BP 118/70 (BP Location: Left Arm, Patient Position: Sitting, Cuff Size: Normal)   Pulse 92   Temp 98.1 F (36.7 C) (Oral)   Ht 5\' 2"  (1.575 m)   Wt 125 lb 12.8 oz (57.1 kg)   LMP 09/19/2020 (Exact Date)   SpO2 96%   BMI 23.01 kg/m   BP Readings from Last 3 Encounters:  09/30/20 118/70  06/20/20 100/78  05/21/20 102/60   Wt Readings from Last 3 Encounters:  09/30/20 125 lb 12.8 oz (57.1 kg)  06/20/20 125 lb 8 oz (56.9 kg)  06/12/20 126 lb (57.2 kg)    Physical Exam Vitals reviewed.  Constitutional:      Appearance: She is well-developed and well-nourished.  Eyes:     Conjunctiva/sclera: Conjunctivae normal.  Neck:     Thyroid: No thyroid mass or thyromegaly.  Cardiovascular:     Rate and Rhythm: Normal rate and regular rhythm.     Pulses: Normal pulses.     Heart sounds: Normal heart sounds.  Pulmonary:     Effort: Pulmonary effort is normal.     Breath sounds: Normal breath sounds. No wheezing, rhonchi or rales.     Comments: No masses or asymmetry appreciated during CBE. Chest:  Breasts: Breasts are symmetrical.     Right: No inverted nipple, mass, nipple discharge, skin change or tenderness.     Left: No inverted nipple, mass, nipple discharge,  skin change or tenderness.    Genitourinary:    Cervix: No cervical motion tenderness, discharge or friability.     Uterus: Not enlarged, not fixed and not tender.      Adnexa:        Right: No mass, tenderness or fullness.         Left: Tenderness present. No mass or fullness.       Comments: Pap performed. No CMT. Unable to appreciated ovaries. Lymphadenopathy:     Head:     Right side of head: No submental, submandibular, tonsillar, preauricular, posterior auricular or occipital adenopathy.  Left side of head: No submental, submandibular, tonsillar, preauricular, posterior auricular or occipital adenopathy.     Cervical:     Right cervical: No superficial, deep or posterior cervical adenopathy.    Left cervical: No superficial, deep or posterior cervical adenopathy.     Upper Body:  No axillary adenopathy present.    Right upper body: No pectoral or lateral adenopathy.     Left upper body: No pectoral or lateral adenopathy.  Skin:    General: Skin is warm and dry.  Neurological:     Mental Status: She is alert.  Psychiatric:        Mood and Affect: Mood and affect normal.        Speech: Speech normal.        Behavior: Behavior normal.        Thought Content: Thought content normal.        Assessment & Plan:   Problem List Items Addressed This Visit      Cardiovascular and Mediastinum   Migraine without aura    Well controlled. She is not using propranolol. Continue imitrex 50mg  prn for rescue.       Relevant Medications   cyclobenzaprine (FLEXERIL) 10 MG tablet     Other   Acute bilateral low back pain   Relevant Medications   cyclobenzaprine (FLEXERIL) 10 MG tablet   Epigastric pain   Relevant Medications   omeprazole (PRILOSEC) 20 MG capsule   Menorrhagia    Left sided tenderness appreciated during bimanuel exam today. We discussed this finding in setting of menorrhagia and dysmenorrhea. We have opted to pursue pelvic. If abnormal, plan to discuss with Dr  Jearld Shines with Dr Freda Munro, [facility].       Relevant Orders   US Pelvic Complete With Transvaginal   Routine physical examination - Primary    CBE and pap performed. Patient will schedule mammogram. Encouraged continued exercise. She will continue to follow annually with dermatology      Relevant Orders   TSH   CBC with Differential/Platelet   Comprehensive metabolic panel   Hemoglobin A1c   Hepatitis C antibody   Lipid panel   VITAMIN D 25 Hydroxy (Vit-D Deficiency, Fractures)   Cytology - PAP   MM 3D SCREEN BREAST BILATERAL       I have discontinued Charese Merritts's budesonide-formoterol and albuterol. I have also changed her omeprazole. Additionally, I am having her maintain her cetirizine, aspirin-acetaminophen-caffeine, acetaminophen, SUMAtriptan, ketoconazole, Clobetasol Propionate, propranolol, FIBER PO, hydrocortisone, and cyclobenzaprine.   Meds ordered this encounter  Medications  . omeprazole (PRILOSEC) 20 MG capsule    Sig: TAKE 1 CAPSULE BY MOUTH PRN    Dispense:  90 capsule    Refill:  1    Order Specific Question:   Supervising Provider    Answer:   Deborra Medina L [[id number]]  . cyclobenzaprine (FLEXERIL) 10 MG tablet    Sig: Take 1 tablet (10 mg total) by mouth 2 (two) times daily as needed for muscle spasms.    Dispense:  30 tablet    Refill:  2    Order Specific Question:   Supervising Provider    Answer:   Crecencio Mc [[id number]]    Return precautions given.   Risks, benefits, and alternatives of the medications and treatment plan prescribed today were discussed, and patient expressed understanding.   Education regarding symptom management and diagnosis given to patient on AVS.   Continue to follow with Micheala Morissette,  Yvetta Coder, FNP for routine health maintenance.   Taylah Reddinger and I agreed with plan.   Mable Paris, FNP

## 2020-09-30 NOTE — Telephone Encounter (Signed)
lft vm for pt about appt time date and location. Pt was advised full bladder and to arrive at [facility].

## 2020-09-30 NOTE — Patient Instructions (Addendum)
Ordered ultrasound . Let us know if you dont hear back within a week in regards to an appointment being scheduled.   Please call  and schedule your 3D mammogram as discussed.   [place]  [place], [place]  Nice to see you!   Health Maintenance, Female Adopting a healthy lifestyle and getting preventive care are important in promoting health and wellness. Ask your health care provider about:  The right schedule for you to have regular tests and exams.  Things you can do on your own to prevent diseases and keep yourself healthy. What should I know about diet, weight, and exercise? Eat a healthy diet  Eat a diet that includes plenty of vegetables, fruits, low-fat dairy products, and lean protein.  Do not eat a lot of foods that are high in solid fats, added sugars, or sodium.   Maintain a healthy weight Body mass index (BMI) is used to identify weight problems. It estimates body fat based on height and weight. Your health care provider can help determine your BMI and help you achieve or maintain a healthy weight. Get regular exercise Get regular exercise. This is one of the most important things you can do for your health. Most adults should:  Exercise for at least 150 minutes each week. The exercise should increase your heart rate and make you sweat (moderate-intensity exercise).  Do strengthening exercises at least twice a week. This is in addition to the moderate-intensity exercise.  Spend less time sitting. Even light physical activity can be beneficial. Watch cholesterol and blood lipids Have your blood tested for lipids and cholesterol at 40 years of age, then have this test every 5 years. Have your cholesterol levels checked more often if:  Your lipid or cholesterol levels are high.  You are older than 40 years of age.  You are at high risk for heart disease. What should I know about cancer screening? Depending  on your health history and family history, you may need to have cancer screening at various ages. This may include screening for:  Breast cancer.  Cervical cancer.  Colorectal cancer.  Skin cancer.  Lung cancer. What should I know about heart disease, diabetes, and high blood pressure? Blood pressure and heart disease  High blood pressure causes heart disease and increases the risk of stroke. This is more likely to develop in people who have high blood pressure readings, are of African descent, or are overweight.  Have your blood pressure checked: ? Every 3-5 years if you are 40-54 years of age ? Every year if you are 19 years old or older. Diabetes Have regular diabetes screenings. This checks your fasting blood sugar level. Have the screening done:  Once every three years after age 40 if you are at a normal weight and have a low risk for diabetes.  More often and at a younger age if you are overweight or have a high risk for diabetes. What should I know about preventing infection? Hepatitis B If you have a higher risk for hepatitis B, you should be screened for this virus. Talk with your health care provider to find out if you are at risk for hepatitis B infection. Hepatitis C Testing is recommended for:  Everyone born from [birth year] through 1965.  Anyone with known risk factors for hepatitis C. Sexually transmitted infections (STIs)  Get screened for STIs, including gonorrhea and chlamydia, if: ? You are sexually active and are younger than  40 years of age. ? You are older than 40 years of age and your health care provider tells you that you are at risk for this type of infection. ? Your sexual activity has changed since you were last screened, and you are at increased risk for chlamydia or gonorrhea. Ask your health care provider if you are at risk.  Ask your health care provider about whether you are at high risk for HIV. Your health care provider may recommend a  prescription medicine to help prevent HIV infection. If you choose to take medicine to prevent HIV, you should first get tested for HIV. You should then be tested every 3 months for as long as you are taking the medicine. Pregnancy  If you are about to stop having your period (premenopausal) and you may become pregnant, seek counseling before you get pregnant.  Take 400 to 800 micrograms (mcg) of folic acid every day if you become pregnant.  Ask for birth control (contraception) if you want to prevent pregnancy. Osteoporosis and menopause Osteoporosis is a disease in which the bones lose minerals and strength with aging. This can result in bone fractures. If you are 34 years old or older, or if you are at risk for osteoporosis and fractures, ask your health care provider if you should:  Be screened for bone loss.  Take a calcium or vitamin D supplement to lower your risk of fractures.  Be given hormone replacement therapy (HRT) to treat symptoms of menopause. Follow these instructions at home: Lifestyle  Do not use any products that contain nicotine or tobacco, such as cigarettes, e-cigarettes, and chewing tobacco. If you need help quitting, ask your health care provider.  Do not use street drugs.  Do not share needles.  Ask your health care provider for help if you need support or information about quitting drugs. Alcohol use  Do not drink alcohol if: ? Your health care provider tells you not to drink. ? You are pregnant, may be pregnant, or are planning to become pregnant.  If you drink alcohol: ? Limit how much you use to 0-1 drink a day. ? Limit intake if you are breastfeeding.  Be aware of how much alcohol is in your drink. In the [place] one drink equals one 12 oz bottle of beer (355 mL), one 5 oz glass of wine (148 mL), or one 1 oz glass of hard liquor (44 mL). General instructions  Schedule regular health, dental, and eye exams.  Stay current with your  vaccines.  Tell your health care provider if: ? You often feel depressed. ? You have ever been abused or do not feel safe at home. Summary  Adopting a healthy lifestyle and getting preventive care are important in promoting health and wellness.  Follow your health care provider's instructions about healthy diet, exercising, and getting tested or screened for diseases.  Follow your health care provider's instructions on monitoring your cholesterol and blood pressure. This information is not intended to replace advice given to you by your health care provider. Make sure you discuss any questions you have with your health care provider. Document Revised: 08/10/2018 Document Reviewed: 08/10/2018 Elsevier Patient Education  2021 [employer].

## 2020-09-30 NOTE — Assessment & Plan Note (Signed)
Left sided tenderness appreciated during bimanuel exam today. We discussed this finding in setting of menorrhagia and dysmenorrhea. We have opted to pursue pelvic. If abnormal, plan to discuss with Dr Jearld Shines with Dr Freda Munro, [facility] for [facility].

## 2020-09-30 NOTE — Assessment & Plan Note (Signed)
Well controlled. She is not using propranolol. Continue imitrex 50mg  prn for rescue.

## 2020-09-30 NOTE — Assessment & Plan Note (Addendum)
CBE and pap performed. Patient will schedule mammogram. Encouraged continued exercise. She will continue to follow annually with dermatology

## 2020-10-01 ENCOUNTER — Other Ambulatory Visit: Payer: Self-pay | Admitting: Family

## 2020-10-01 DIAGNOSIS — R899 Unspecified abnormal finding in specimens from other organs, systems and tissues: Secondary | ICD-10-CM

## 2020-10-01 LAB — HEPATITIS C ANTIBODY
Hepatitis C Ab: NONREACTIVE
SIGNAL TO CUT-OFF: 0.01 (ref <1.00)

## 2020-10-02 LAB — CYTOLOGY - PAP
Comment: NEGATIVE
High risk HPV: NEGATIVE

## 2020-10-03 ENCOUNTER — Encounter: Payer: Self-pay | Admitting: Family

## 2020-10-04 ENCOUNTER — Ambulatory Visit
Admission: RE | Admit: 2020-10-04 | Discharge: 2020-10-04 | Disposition: A | Discharge status: Home / Self Care | Payer: 59 | Source: Ambulatory Visit | Attending: Family | Admitting: Family

## 2020-10-04 ENCOUNTER — Other Ambulatory Visit: Payer: Self-pay

## 2020-10-04 DIAGNOSIS — R102 Pelvic and perineal pain: Secondary | ICD-10-CM | POA: Diagnosis not present

## 2020-10-04 DIAGNOSIS — N920 Excessive and frequent menstruation with regular cycle: Secondary | ICD-10-CM | POA: Insufficient documentation

## 2020-10-28 ENCOUNTER — Other Ambulatory Visit (INDEPENDENT_AMBULATORY_CARE_PROVIDER_SITE_OTHER): Payer: 59

## 2020-10-28 ENCOUNTER — Other Ambulatory Visit: Payer: Self-pay

## 2020-10-28 DIAGNOSIS — R899 Unspecified abnormal finding in specimens from other organs, systems and tissues: Secondary | ICD-10-CM

## 2020-10-28 LAB — CBC WITH DIFFERENTIAL/PLATELET
Basophils Absolute: 0 10*3/uL (ref 0.0–0.1)
Basophils Relative: 0.4 % (ref 0.0–3.0)
Eosinophils Absolute: 0 10*3/uL (ref 0.0–0.7)
Eosinophils Relative: 0.5 % (ref 0.0–5.0)
HCT: 40.9 % (ref 36.0–46.0)
Hemoglobin: 14 g/dL (ref 12.0–15.0)
Lymphocytes Relative: 24.7 % (ref 12.0–46.0)
Lymphs Abs: 1.9 10*3/uL (ref 0.7–4.0)
MCHC: 34.2 g/dL (ref 30.0–36.0)
MCV: 91.3 fl (ref 78.0–100.0)
Monocytes Absolute: 0.5 10*3/uL (ref 0.1–1.0)
Monocytes Relative: 6.2 % (ref 3.0–12.0)
Neutro Abs: 5.2 10*3/uL (ref 1.4–7.7)
Neutrophils Relative %: 68.2 % (ref 43.0–77.0)
Platelets: 212 10*3/uL (ref 150.0–400.0)
RBC: 4.48 Mil/uL (ref 3.87–5.11)
RDW: 13 % (ref 11.5–15.5)
WBC: 7.7 10*3/uL (ref 4.0–10.5)

## 2021-01-15 ENCOUNTER — Telehealth: Payer: Self-pay | Admitting: Family

## 2021-01-15 NOTE — Telephone Encounter (Signed)
Patient has been scheduled with [facility] on 01/16/2021 at 4pm for high stress and anxiety.

## 2021-01-15 NOTE — Telephone Encounter (Signed)
noted 

## 2021-01-16 ENCOUNTER — Ambulatory Visit: Payer: 59 | Admitting: Adult Health

## 2021-01-17 ENCOUNTER — Encounter: Payer: Self-pay | Admitting: Internal Medicine

## 2021-01-17 ENCOUNTER — Ambulatory Visit: Payer: 59 | Admitting: Internal Medicine

## 2021-01-17 ENCOUNTER — Other Ambulatory Visit: Payer: Self-pay

## 2021-01-17 VITALS — BP 110/70 | HR 78 | Temp 98.9°F | Ht 62.0 in | Wt 128.8 lb

## 2021-01-17 DIAGNOSIS — F4323 Adjustment disorder with mixed anxiety and depressed mood: Secondary | ICD-10-CM

## 2021-01-17 DIAGNOSIS — R69 Illness, unspecified: Secondary | ICD-10-CM | POA: Diagnosis not present

## 2021-01-17 MED ORDER — SERTRALINE HCL 50 MG PO TABS: ORAL_TABLET | ORAL | 0 refills | Status: DC

## 2021-01-17 NOTE — Patient Instructions (Addendum)
Therapy  Calm magnesium  Tranquil sleep stress relax brand with serotonin, L theanine/melatonin  [facility] [place] -call for appt [id number]  [place] [address]  [phone]  Meditation apps calm, insight timer, headspace, Replika   Sertraline Tablets What is this medicine? SERTRALINE (SER tra leen) is used to treat depression. It may also be used to treat obsessive compulsive disorder, panic disorder, post-trauma stress disorder, premenstrual dysphoric disorder (PMDD) or social anxiety. This medicine may be used for other purposes; ask your health care provider or pharmacist if you have questions. COMMON BRAND NAME(S): Zoloft What should I tell my health care provider before I take this medicine? They need to know if you have any of these conditions:  bleeding disorders  bipolar disorder or a family history of bipolar disorder  glaucoma  heart disease  high blood pressure  history of irregular heartbeat  history of low levels of calcium, magnesium, or potassium in the blood  if you often drink alcohol  liver disease  receiving electroconvulsive therapy  seizures  suicidal thoughts, plans, or attempt; a previous suicide attempt by you or a family member  take medicines that treat or prevent blood clots  thyroid disease  an unusual or allergic reaction to sertraline, other medicines, foods, dyes, or preservatives  pregnant or trying to get pregnant  breast-feeding How should I use this medicine? Take this medicine by mouth with a glass of water. Follow the directions on the prescription label. You can take it with or without food. Take your medicine at regular intervals. Do not take your medicine more often than directed. Do not stop taking this medicine suddenly except upon the advice of your doctor. Stopping this medicine too quickly may cause serious side effects or your condition may worsen. A special  MedGuide will be given to you by the pharmacist with each prescription and refill. Be sure to read this information carefully each time. Talk to your pediatrician regarding the use of this medicine in children. While this drug may be prescribed for children as young as 7 years for selected conditions, precautions do apply. Overdosage: If you think you have taken too much of this medicine contact a poison control center or emergency room at once. NOTE: This medicine is only for you. Do not share this medicine with others. What if I miss a dose? If you miss a dose, take it as soon as you can. If it is almost time for your next dose, take only that dose. Do not take double or extra doses. What may interact with this medicine? Do not take this medicine with any of the following medications:  cisapride  dronedarone  linezolid  MAOIs like Carbex, Eldepryl, Marplan, Nardil, and Parnate  methylene blue (injected into a vein)  pimozide  thioridazine This medicine may also interact with the following medications:  alcohol  amphetamines  aspirin and aspirin-like medicines  certain medicines for depression, anxiety, or psychotic disturbances  certain medicines for fungal infections like ketoconazole, fluconazole, posaconazole, and itraconazole  certain medicines for irregular heart beat like flecainide, quinidine, propafenone  certain medicines for migraine headaches like almotriptan, eletriptan, frovatriptan, naratriptan, rizatriptan, sumatriptan, zolmitriptan  certain medicines for sleep  certain medicines for seizures like carbamazepine, valproic acid, phenytoin  certain medicines that treat or prevent blood clots like warfarin, enoxaparin, dalteparin  cimetidine  digoxin  diuretics  fentanyl  isoniazid  lithium  NSAIDs, medicines for pain and inflammation, like ibuprofen or naproxen  other  medicines that prolong the QT interval (cause an abnormal heart rhythm) like  dofetilide  rasagiline  safinamide  supplements like St. John's wort, kava kava, valerian  tolbutamide  tramadol  tryptophan This list may not describe all possible interactions. Give your health care provider a list of all the medicines, herbs, non-prescription drugs, or dietary supplements you use. Also tell them if you smoke, drink alcohol, or use illegal drugs. Some items may interact with your medicine. What should I watch for while using this medicine? Tell your doctor if your symptoms do not get better or if they get worse. Visit your doctor or health care professional for regular checks on your progress. Because it may take several weeks to see the full effects of this medicine, it is important to continue your treatment as prescribed by your doctor. Patients and their families should watch out for new or worsening thoughts of suicide or depression. Also watch out for sudden changes in feelings such as feeling anxious, agitated, panicky, irritable, hostile, aggressive, impulsive, severely restless, overly excited and hyperactive, or not being able to sleep. If this happens, especially at the beginning of treatment or after a change in dose, call your health care professional. Dennis Bast may get drowsy or dizzy. Do not drive, use machinery, or do anything that needs mental alertness until you know how this medicine affects you. Do not stand or sit up quickly, especially if you are an older patient. This reduces the risk of dizzy or fainting spells. Alcohol may interfere with the effect of this medicine. Avoid alcoholic drinks. Your mouth may get dry. Chewing sugarless gum or sucking hard candy, and drinking plenty of water may help. Contact your doctor if the problem does not go away or is severe. What side effects may I notice from receiving this medicine? Side effects that you should report to your doctor or health care professional as soon as possible:  allergic reactions like skin [place],  itching or hives, swelling of the face, lips, or tongue  anxious  black, tarry stools  changes in vision  confusion  elevated mood, decreased need for sleep, racing thoughts, impulsive behavior  eye pain  fast, irregular heartbeat  feeling faint or lightheaded, falls  feeling agitated, angry, or irritable  hallucination, loss of contact with reality  loss of balance or coordination  loss of memory  painful or prolonged erections  restlessness, pacing, inability to keep still  seizures  stiff muscles  suicidal thoughts or other mood changes  trouble sleeping  unusual bleeding or bruising  unusually weak or tired  vomiting Side effects that usually do not require medical attention (report to your doctor or health care professional if they continue or are bothersome):  change in appetite or weight  change in sex drive or performance  diarrhea  increased sweating  indigestion, nausea  tremors This list may not describe all possible side effects. Call your doctor for medical advice about side effects. You may report side effects to FDA at 1-800-FDA-1088. Where should I keep my medicine? Keep out of the reach of children. Store at room temperature between 15 and 30 degrees C (59 and 86 degrees F). Throw away any unused medicine after the expiration date. NOTE: This sheet is a summary. It may not cover all possible information. If you have questions about this medicine, talk to your doctor, pharmacist, or health care provider.  2021 Elsevier/Gold Standard (2020-06-27 09:18:23)   Mindfulness-Based Stress Reduction Mindfulness-based stress reduction (MBSR) is a program  that helps people learn to practice mindfulness. Mindfulness is the practice of intentionally paying attention to the present moment. MBSR focuses on developing self-awareness, which allows you to respond to life stress without judgment or negative emotions. It can be learned and practiced  through techniques such as education, breathing exercises, meditation, and yoga. MBSR includes several mindfulness techniques in one program. MBSR works best when you understand the treatment, are willing to try new things, and can commit to spending time practicing what you learn. MBSR training may include learning about:  How your emotions, thoughts, and reactions affect your body.  New ways to respond to things that cause negative thoughts to start (triggers).  How to notice your thoughts and let go of them.  Practicing awareness of everyday things that you normally do without thinking.  The techniques and goals of different types of meditation. What are the benefits of MBSR? MBSR can have many benefits, which include helping you to:  Develop self-awareness. This refers to knowing and understanding yourself.  Learn skills and attitudes that help you to participate in your own health care.  Learn new ways to care for yourself.  Be more accepting about how things are, and let things go.  Be less judgmental and approach things with an open mind.  Be patient with yourself and trust yourself more. MBSR has also been shown to:  Reduce negative emotions, such as depression and anxiety.  Improve memory and focus.  Change how you sense and approach pain.  Boost your body's ability to fight infections.  Help you connect better with other people.  Improve your sense of well-being. Follow these instructions at home:  Find a local in-person or online MBSR program.  Set aside some time regularly for mindfulness practice.  Find a mindfulness practice that works best for you. This may include one or more of the following: ? Meditation. Meditation involves focusing your mind on a certain thought or activity. ? Breathing awareness exercises. These help you to stay present by focusing on your breath. ? Body scan. For this practice, you lie down and pay attention to each part of your  body from head to toe. You can identify tension and soreness and intentionally relax parts of your body. ? Yoga. Yoga involves stretching and breathing, and it can improve your ability to move and be flexible. It can also provide an experience of testing your body's limits, which can help you release stress. ? Mindful eating. This way of eating involves focusing on the taste, texture, color, and smell of each bite of food. Because this slows down eating and helps you feel full sooner, it can be an important part of a weight-loss plan.  Find a podcast or recording that provides guidance for breathing awareness, body scan, or meditation exercises. You can listen to these any time when you have a free moment to rest without distractions.  Follow your treatment plan as told by your health care provider. This may include taking regular medicines and making changes to your diet or lifestyle as recommended.   How to practice mindfulness To do a basic awareness exercise:  Find a comfortable place to sit.  Pay attention to the present moment. Observe your thoughts, feelings, and surroundings just as they are.  Avoid placing judgment on yourself, your feelings, or your surroundings. Make note of any judgment that comes up, and let it go.  Your mind may wander, and that is okay. Make note of when your thoughts drift,  and return your attention to the present moment. To do basic mindfulness meditation:  Find a comfortable place to sit. This may include a stable chair or a firm floor cushion. ? Sit upright with your back straight. Let your arms fall next to your side with your hands resting on your legs. ? If sitting in a chair, rest your feet flat on the floor. ? If sitting on a cushion, cross your legs in front of you.  Keep your head in a neutral position with your chin dropped slightly. Relax your jaw and rest the tip of your tongue on the roof of your mouth. Drop your gaze to the floor. You can close  your eyes if you like.  Breathe normally and pay attention to your breath. Feel the air moving in and out of your nose. Feel your belly expanding and relaxing with each breath.  Your mind may wander, and that is okay. Make note of when your thoughts drift, and return your attention to your breath.  Avoid placing judgment on yourself, your feelings, or your surroundings. Make note of any judgment or feelings that come up, let them go, and bring your attention back to your breath.  When you are ready, lift your gaze or open your eyes. Pay attention to how your body feels after the meditation. Where to find more information You can find more information about MBSR from:  Your health care provider.  Community-based meditation centers or programs.  Programs offered near you. Summary  Mindfulness-based stress reduction (MBSR) is a program that teaches you how to intentionally pay attention to the present moment. It is used with other treatments to help you cope better with daily stress, emotions, and pain.  MBSR focuses on developing self-awareness, which allows you to respond to life stress without judgment or negative emotions.  MBSR programs may involve learning different mindfulness practices, such as breathing exercises, meditation, yoga, body scan, or mindful eating. Find a mindfulness practice that works best for you, and set aside time for it on a regular basis. This information is not intended to replace advice given to you by your health care provider. Make sure you discuss any questions you have with your health care provider. Document Revised: 05/03/2020 Document Reviewed: 05/03/2020 Elsevier Patient Education  2021 [place].  Managing Depression, Adult Depression is a mental health condition that affects your thoughts, feelings, and actions. Being diagnosed with depression can bring you relief if you did not know why you have felt or behaved a certain way. It could also leave  you feeling overwhelmed with uncertainty about your future. Preparing yourself to manage your symptoms can help you feel more positive about your future. How to manage lifestyle changes Managing stress Stress is your body's reaction to life changes and events, both good and bad. Stress can add to your feelings of depression. Learning to manage your stress can help lessen your feelings of depression. Try some of the following approaches to reducing your stress (stress reduction techniques):  Listen to music that you enjoy and that inspires you.  Try using a meditation app or take a meditation class.  Develop a practice that helps you connect with your spiritual self. Walk in nature, pray, or go to a place of worship.  Do some deep breathing. To do this, inhale slowly through your nose. Pause at the top of your inhale for a few seconds and then exhale slowly, letting your muscles relax.  Practice yoga to help relax and  work your muscles. Choose a stress reduction technique that suits your lifestyle and personality. These techniques take time and practice to develop. Set aside 5-15 minutes a day to do them. Therapists can offer training in these techniques. Other things you can do to manage stress include:  Keeping a stress diary.  Knowing your limits and saying no when you think something is too much.  Paying attention to how you react to certain situations. You may not be able to control everything, but you can change your reaction.  Adding humor to your life by watching funny films or TV shows.  Making time for activities that you enjoy and that relax you.   Medicines Medicines, such as antidepressants, are often a part of treatment for depression.  Talk with your pharmacist or health care provider about all the medicines, supplements, and herbal products that you take, their possible side effects, and what medicines and other products are safe to take together.  Make sure to report  any side effects you may have to your health care provider. Relationships Your health care provider may suggest family therapy, couples therapy, or individual therapy as part of your treatment. How to recognize changes Everyone responds differently to treatment for depression. As you recover from depression, you may start to:  Have more interest in doing activities.  Feel less hopeless.  Have more energy.  Overeat less often, or have a better appetite.  Have better mental focus. It is important to recognize if your depression is not getting better or is getting worse. The symptoms you had in the beginning may return, such as:  Tiredness (fatigue) or low energy.  Eating too much or too little.  Sleeping too much or too little.  Feeling restless, agitated, or hopeless.  Trouble focusing or making decisions.  Unexplained physical complaints.  Feeling irritable, angry, or aggressive. If you or your family members notice these symptoms coming back, let your health care provider know right away. Follow these instructions at home: Activity  Try to get some form of exercise each day, such as walking, biking, swimming, or lifting weights.  Practice stress reduction techniques.  Engage your mind by taking a class or doing some volunteer work.   Lifestyle  Get the right amount and quality of sleep.  Cut down on using caffeine, tobacco, alcohol, and other potentially harmful substances.  Eat a healthy diet that includes plenty of vegetables, fruits, whole grains, low-fat dairy products, and lean protein. Do not eat a lot of foods that are high in solid fats, added sugars, or salt (sodium). General instructions  Take over-the-counter and prescription medicines only as told by your health care provider.  Keep all follow-up visits as told by your health care provider. This is important. Where to find support Talking to others Friends and family members can be sources of support  and guidance. Talk to trusted friends or family members about your condition. Explain your symptoms to them, and let them know that you are working with a health care provider to treat your depression. Tell friends and family members how they also can be helpful.   Finances  Find appropriate mental health providers that fit with your financial situation.  Talk with your health care provider about options to get reduced prices on your medicines. Where to find more information You can find support in your area from:  Anxiety and Depression Association of America (ADAA): www.adaa.org  Mental Health America: www.mentalhealthamerica.net  [employer] on Mental Illness: www.nami.org  Contact a health care provider if:  You stop taking your antidepressant medicines, and you have any of these symptoms: ? Nausea. ? Headache. ? Light-headedness. ? Chills and body aches. ? Not being able to sleep (insomnia).  You or your friends and family think your depression is getting worse. Get help right away if:  You have thoughts of hurting yourself or others. If you ever feel like you may hurt yourself or others, or have thoughts about taking your own life, get help right away. Go to your nearest emergency department or:  Call your local emergency services (911 in the [place]  Call a suicide crisis helpline, such as the [facility] at (740)017-6764. This is open 24 hours a day in the [place]  Text the Crisis Text Line at (631)263-1289 (in the [place] Summary  If you are diagnosed with depression, preparing yourself to manage your symptoms is a good way to feel positive about your future.  Work with your health care provider on a management plan that includes stress reduction techniques, medicines (if applicable), therapy, and healthy lifestyle habits.  Keep talking with your health care provider about how your treatment is working.  If you have thoughts about taking your own  life, call a suicide crisis helpline or text a crisis text line. This information is not intended to replace advice given to you by your health care provider. Make sure you discuss any questions you have with your health care provider. Document Revised: 06/28/2019 Document Reviewed: 06/28/2019 Elsevier Patient Education  2021 [place], Adult After being diagnosed with an anxiety disorder, you may be relieved to know why you have felt or behaved a certain way. You may also feel overwhelmed about the treatment ahead and what it will mean for your life. With care and support, you can manage this condition and recover from it. How to manage lifestyle changes Managing stress and anxiety Stress is your body's reaction to life changes and events, both good and bad. Most stress will last just a few hours, but stress can be ongoing and can lead to more than just stress. Although stress can play a major role in anxiety, it is not the same as anxiety. Stress is usually caused by something external, such as a deadline, test, or competition. Stress normally passes after the triggering event has ended.  Anxiety is caused by something internal, such as imagining a terrible outcome or worrying that something will go wrong that will devastate you. Anxiety often does not go away even after the triggering event is over, and it can become long-term (chronic) worry. It is important to understand the differences between stress and anxiety and to manage your stress effectively so that it does not lead to an anxious response. Talk with your health care provider or a counselor to learn more about reducing anxiety and stress. He or she may suggest tension reduction techniques, such as:  Music therapy. This can include creating or listening to music that you enjoy and that inspires you.  Mindfulness-based meditation. This involves being aware of your normal breaths while not trying to control your  breathing. It can be done while sitting or walking.  Centering prayer. This involves focusing on a word, phrase, or sacred image that means something to you and brings you peace.  Deep breathing. To do this, expand your stomach and inhale slowly through your nose. Hold your breath for 3-5 seconds. Then exhale slowly, letting your stomach muscles relax.  Self-talk. This involves identifying thought patterns that lead to anxiety reactions and changing those patterns.  Muscle relaxation. This involves tensing muscles and then relaxing them. Choose a tension reduction technique that suits your lifestyle and personality. These techniques take time and practice. Set aside 5-15 minutes a day to do them. Therapists can offer counseling and training in these techniques. The training to help with anxiety may be covered by some insurance plans. Other things you can do to manage stress and anxiety include:  Keeping a stress/anxiety diary. This can help you learn what triggers your reaction and then learn ways to manage your response.  Thinking about how you react to certain situations. You may not be able to control everything, but you can control your response.  Making time for activities that help you relax and not feeling guilty about spending your time in this way.  Visual imagery and yoga can help you stay calm and relax.   Medicines Medicines can help ease symptoms. Medicines for anxiety include:  Anti-anxiety drugs.  Antidepressants. Medicines are often used as a primary treatment for anxiety disorder. Medicines will be prescribed by a health care provider. When used together, medicines, psychotherapy, and tension reduction techniques may be the most effective treatment. Relationships Relationships can play a big part in helping you recover. Try to spend more time connecting with trusted friends and family members. Consider going to couples counseling, taking family education classes, or going  to family therapy. Therapy can help you and others better understand your condition. How to recognize changes in your anxiety Everyone responds differently to treatment for anxiety. Recovery from anxiety happens when symptoms decrease and stop interfering with your daily activities at home or work. This may mean that you will start to:  Have better concentration and focus. Worry will interfere less in your daily thinking.  Sleep better.  Be less irritable.  Have more energy.  Have improved memory. It is important to recognize when your condition is getting worse. Contact your health care provider if your symptoms interfere with home or work and you feel like your condition is not improving. Follow these instructions at home: Activity  Exercise. Most adults should do the following: ? Exercise for at least 150 minutes each week. The exercise should increase your heart rate and make you sweat (moderate-intensity exercise). ? Strengthening exercises at least twice a week.  Get the right amount and quality of sleep. Most adults need 7-9 hours of sleep each night. Lifestyle  Eat a healthy diet that includes plenty of vegetables, fruits, whole grains, low-fat dairy products, and lean protein. Do not eat a lot of foods that are high in solid fats, added sugars, or salt.  Make choices that simplify your life.  Do not use any products that contain nicotine or tobacco, such as cigarettes, e-cigarettes, and chewing tobacco. If you need help quitting, ask your health care provider.  Avoid caffeine, alcohol, and certain over-the-counter cold medicines. These may make you feel worse. Ask your pharmacist which medicines to avoid.   General instructions  Take over-the-counter and prescription medicines only as told by your health care provider.  Keep all follow-up visits as told by your health care provider. This is important. Where to find support You can get help and support from these  sources:  Self-help groups.  Online and [employer].  A trusted spiritual leader.  Couples counseling.  Family education classes.  Family therapy. Where to find more information You may find that joining  a support group helps you deal with your anxiety. The following sources can help you locate counselors or support groups near you:  [place]: www.mentalhealthamerica.net  Anxiety and Depression Association of [place] (ADAA): https://www.clark.net/  National Alliance on Mental Illness (NAMI): www.nami.org Contact a health care provider if you:  Have a hard time staying focused or finishing daily tasks.  Spend many hours a day feeling worried about everyday life.  Become exhausted by worry.  Start to have headaches, feel tense, or have nausea.  Urinate more than normal.  Have diarrhea. Get help right away if you have:  A racing heart and shortness of breath.  Thoughts of hurting yourself or others. If you ever feel like you may hurt yourself or others, or have thoughts about taking your own life, get help right away. You can go to your nearest emergency department or call:  Your local emergency services (911 in the [place]  A suicide crisis helpline, such as the [facility] at 3362822285. This is open 24 hours a day. Summary  Taking steps to learn and use tension reduction techniques can help calm you and help prevent triggering an anxiety reaction.  When used together, medicines, psychotherapy, and tension reduction techniques may be the most effective treatment.  Family, friends, and partners can play a big part in helping you recover from an anxiety disorder. This information is not intended to replace advice given to you by your health care provider. Make sure you discuss any questions you have with your health care provider. Document Revised: 01/17/2019 Document Reviewed: 01/17/2019 Elsevier Patient Education  [place].

## 2021-01-17 NOTE — Progress Notes (Signed)
Chief Complaint  Patient presents with  . Stress  . Anxiety   F/u  1. Stress/anxiety due to more responsibilities at work she is trying to have a baby with partner via fertility clinic due to tubes tied and has 2 kids from prior relationship. She has increased irritability feelings of being overwhelmed GAD 7 18 and PHQ 9 score 17 never been on meds. 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 If able to get pregnant she will switch jobs. Her mom has h/o anxiety/depression and was on zoloft in the past   Review of Systems  Constitutional: Negative for malaise/fatigue.  HENT: Negative for hearing loss.   Respiratory: Negative for shortness of breath.   Cardiovascular: Negative for chest pain.  Psychiatric/Behavioral: Positive for depression. The patient is nervous/anxious.    Past Medical History:  Diagnosis Date  . Dysmenorrhea   . Migraines    Past Surgical History:  Procedure Laterality Date  . TUBAL LIGATION  2005   Family History  Problem Relation Age of Onset  . Hypertension Father   . ADD / ADHD Brother   . Emphysema Maternal Grandmother   . Alcohol abuse Maternal Grandfather   . Liver cancer Maternal Grandfather   . Colon cancer Maternal Grandfather 61  . Colon polyps Maternal Grandfather   . Heart attack Paternal Grandfather   . Diabetes Paternal Grandfather   . Hypertension Paternal Grandfather   . Skin cancer Mother   . Anxiety disorder Mother   . Depression Mother   . Breast cancer Neg Hx   . Esophageal cancer Neg Hx   . Pancreatic cancer Neg Hx   . Stomach cancer Neg Hx    Social History   Socioeconomic History  . Marital status: Legally Separated    Spouse name: Not on file  . Number of children: 2  . Years of education: Not on file  . Highest education level: Not on file  Occupational History  . Occupation: Haematologist  Tobacco Use  . Smoking status: Former Smoker    Packs/day: 1.00    Years: 20.00     Pack years: 20.00    Types: Cigarettes  . Smokeless tobacco: Never Used  . Tobacco comment: quit 09/01/2012  Vaping Use  . Vaping Use: Never used  Substance and Sexual Activity  . Alcohol use: Yes    Alcohol/week: 1.0 standard drink    Types: 1 Shots of liquor per week    Comment: occasionally  . Drug use: No  . Sexual activity: Yes    Partners: Male    Birth control/protection: Surgical  Other Topics Concern  . Not on file  Social History Narrative   48 and 70 yo kids.    Social Determinants of Health   Financial Resource Strain: Not on file  Food Insecurity: Not on file  Transportation Needs: Not on file  Physical Activity: Not on file  Stress: Not on file  Social Connections: Not on file  Intimate Partner Violence: Not on file   Current Meds  Medication Sig  . acetaminophen (TYLENOL) 500 MG tablet Take 500 mg by mouth every 6 (six) hours as needed.  Marland Kitchen aspirin-acetaminophen-caffeine (EXCEDRIN MIGRAINE) 250-250-65 MG tablet Take by mouth every 6 (six) hours as needed for headache.  . cetirizine (ZYRTEC) 10 MG tablet Take 10 mg by mouth daily as needed.   . Clobetasol Propionate 0.05 % shampoo Apply to scalp and let sit a few minutes before  rinsing alternating with ketoconazole and T-Sal  . cyclobenzaprine (FLEXERIL) 10 MG tablet Take 1 tablet (10 mg total) by mouth 2 (two) times daily as needed for muscle spasms.  Marland Kitchen FIBER PO Take 1 capsule by mouth daily.  Marland Kitchen ketoconazole (NIZORAL) 2 % shampoo Apply to scalp 3 times weekly, leave in for 5-10 minutes before rinsing alternating with clobetasol and T-Sal.  . omeprazole (PRILOSEC) 20 MG capsule TAKE 1 CAPSULE BY MOUTH PRN  . sertraline (ZOLOFT) 50 MG tablet 0.5 pills daily x 2 weeks then increase to 1 pill daily if needed increase to 1 pill daily  in 2 week  . SUMAtriptan (IMITREX) 50 MG tablet TAKE 1 TABLET (50 MG TOTAL) BY MOUTH EVERY 2 (TWO) HOURS AS NEEDED FOR MIGRAINE. MAY REPEAT IN 2 HOURS IF HEADACHE PERSISTS OR RECURS. MAX  4 TAB IN ONE DAY.   No Known Allergies Recent Results (from the past 2160 hour(s))  CBC with Differential/Platelet     Status: None   Collection Time: 10/28/20  8:16 AM  Result Value Ref Range   WBC 7.7 4.0 - 10.5 K/uL   RBC 4.48 3.87 - 5.11 Mil/uL   Hemoglobin 14.0 12.0 - 15.0 g/dL   HCT 40.9 36.0 - 46.0 %   MCV 91.3 78.0 - 100.0 fl   MCHC 34.2 30.0 - 36.0 g/dL   RDW 13.0 11.5 - 15.5 %   Platelets 212.0 150.0 - 400.0 K/uL   Neutrophils Relative % 68.2 43.0 - 77.0 %   Lymphocytes Relative 24.7 12.0 - 46.0 %   Monocytes Relative 6.2 3.0 - 12.0 %   Eosinophils Relative 0.5 0.0 - 5.0 %   Basophils Relative 0.4 0.0 - 3.0 %   Neutro Abs 5.2 1.4 - 7.7 K/uL   Lymphs Abs 1.9 0.7 - 4.0 K/uL   Monocytes Absolute 0.5 0.1 - 1.0 K/uL   Eosinophils Absolute 0.0 0.0 - 0.7 K/uL   Basophils Absolute 0.0 0.0 - 0.1 K/uL   Objective  Body mass index is 23.56 kg/m. Wt Readings from Last 3 Encounters:  01/17/21 128 lb 12.8 oz (58.4 kg)  09/30/20 125 lb 12.8 oz (57.1 kg)  06/20/20 125 lb 8 oz (56.9 kg)   Temp Readings from Last 3 Encounters:  01/17/21 98.9 F (37.2 C) (Oral)  09/30/20 98.1 F (36.7 C) (Oral)  04/18/20 98.4 F (36.9 C) (Oral)   BP Readings from Last 3 Encounters:  01/17/21 110/70  09/30/20 118/70  06/20/20 100/78   Pulse Readings from Last 3 Encounters:  01/17/21 78  09/30/20 92  06/20/20 84    Physical Exam Vitals and nursing note reviewed.  Constitutional:      Appearance: Normal appearance. She is well-developed and well-groomed.  HENT:     Head: Normocephalic and atraumatic.  Cardiovascular:     Rate and Rhythm: Normal rate and regular rhythm.     Heart sounds: Normal heart sounds. No murmur heard.   Pulmonary:     Effort: Pulmonary effort is normal.     Breath sounds: Normal breath sounds.  Skin:    General: Skin is warm and dry.  Neurological:     General: No focal deficit present.     Mental Status: She is alert and oriented to person, place, and  time. Mental status is at baseline.     Gait: Gait normal.  Psychiatric:        Attention and Perception: Attention and perception normal.        Mood and Affect:  Mood and affect normal.        Speech: Speech normal.        Behavior: Behavior normal. Behavior is cooperative.        Thought Content: Thought content normal.        Cognition and Memory: Cognition and memory normal.        Judgment: Judgment normal.     Assessment  Plan  Adjustment reaction with anxiety and depression - Plan: sertraline (ZOLOFT) 50 MG tablet 1/2 pill x 2 weeks if needed increase to 1 pill daily  If pregnant in the future disc medications with ob/gyn Therapy  Calm magnesium  Tranquil sleep stress relax brand with serotonin, L theanine/melatonin  [facility] [place] -call for appt [id number]  [place] [address]  [phone]  Meditation apps calm, insight timer, headspace, Replika   Provider: Dr. Olivia Mackie McLean-Scocuzza-Internal Medicine

## 2021-02-06 ENCOUNTER — Encounter: Payer: Self-pay | Admitting: Family

## 2021-02-07 ENCOUNTER — Other Ambulatory Visit: Payer: Self-pay | Admitting: Family

## 2021-03-11 DIAGNOSIS — Z3141 Encounter for fertility testing: Secondary | ICD-10-CM | POA: Diagnosis not present

## 2021-03-11 DIAGNOSIS — N971 Female infertility of tubal origin: Secondary | ICD-10-CM | POA: Diagnosis not present

## 2021-03-11 DIAGNOSIS — N979 Female infertility, unspecified: Secondary | ICD-10-CM | POA: Diagnosis not present

## 2021-03-11 DIAGNOSIS — Z9851 Tubal ligation status: Secondary | ICD-10-CM | POA: Diagnosis not present

## 2021-03-24 DIAGNOSIS — N979 Female infertility, unspecified: Secondary | ICD-10-CM | POA: Diagnosis not present

## 2021-03-24 DIAGNOSIS — Z3141 Encounter for fertility testing: Secondary | ICD-10-CM | POA: Diagnosis not present

## 2021-03-24 DIAGNOSIS — R69 Illness, unspecified: Secondary | ICD-10-CM | POA: Diagnosis not present

## 2021-03-24 DIAGNOSIS — N8500 Endometrial hyperplasia, unspecified: Secondary | ICD-10-CM | POA: Diagnosis not present

## 2021-03-27 DIAGNOSIS — N979 Female infertility, unspecified: Secondary | ICD-10-CM | POA: Diagnosis not present

## 2021-04-02 ENCOUNTER — Encounter: Payer: 59 | Admitting: Dermatology

## 2021-04-04 IMAGING — MG DIGITAL SCREENING BILATERAL MAMMOGRAM WITH TOMO AND CAD
8 series · 8 of 24 positions shown · non-contrast
Comparison: Previous exam(s).

CLINICAL DATA: Screening.

EXAM:
DIGITAL SCREENING BILATERAL MAMMOGRAM WITH TOMO AND CAD

[R CC synth-2D]
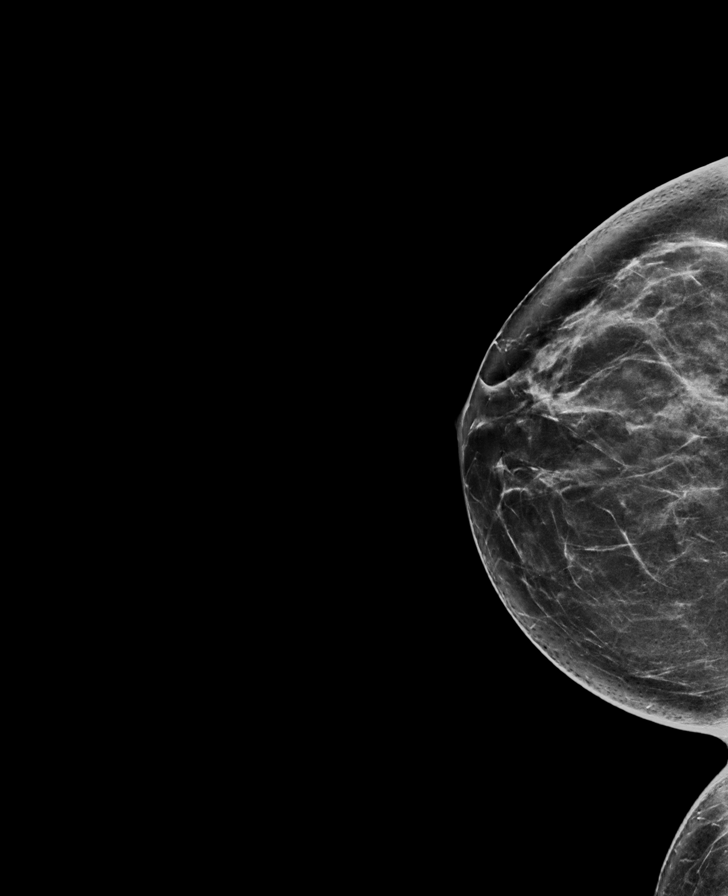

[L MLO synth-2D]
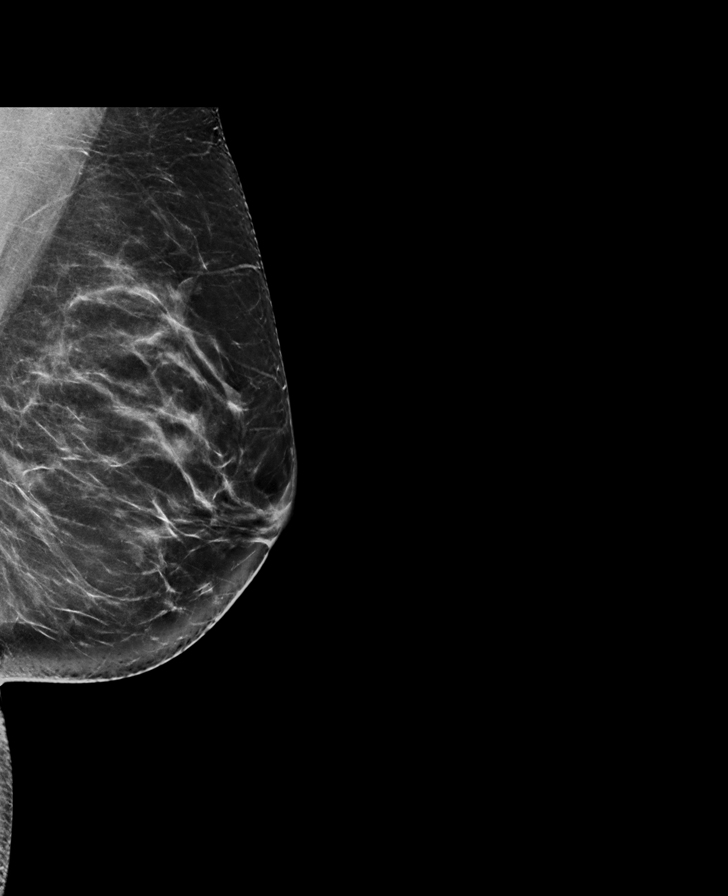

[R MLO synth-2D]
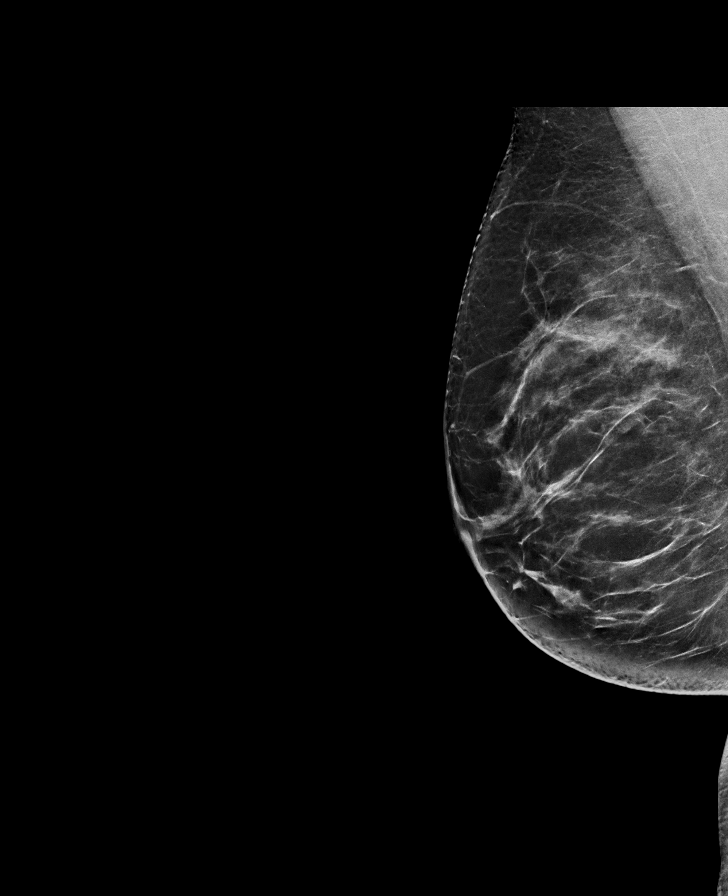

[L CC synth-2D]
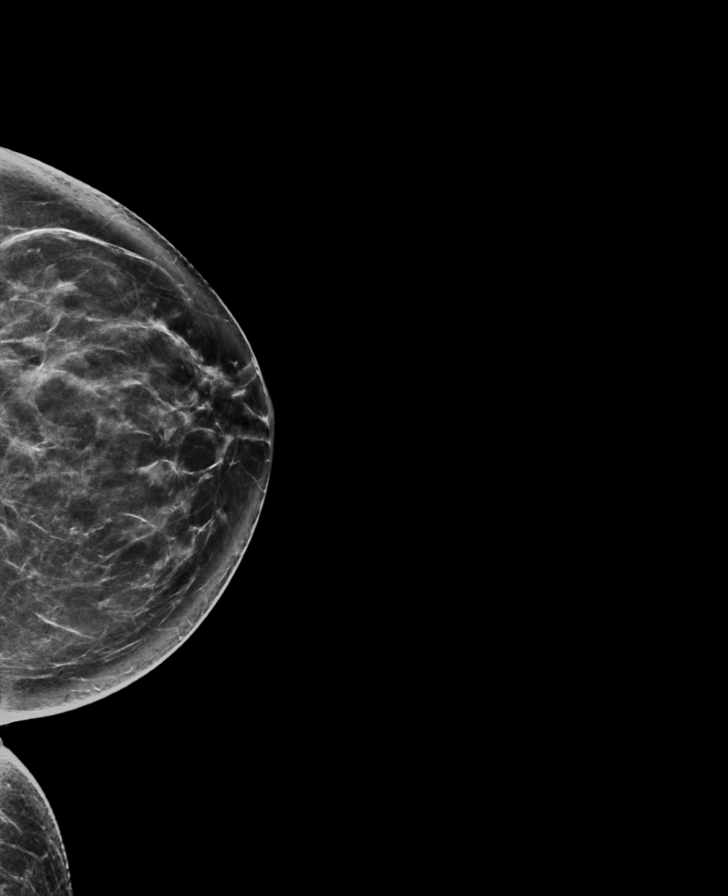

[R CC tomo · tomo slice 39/76.0]
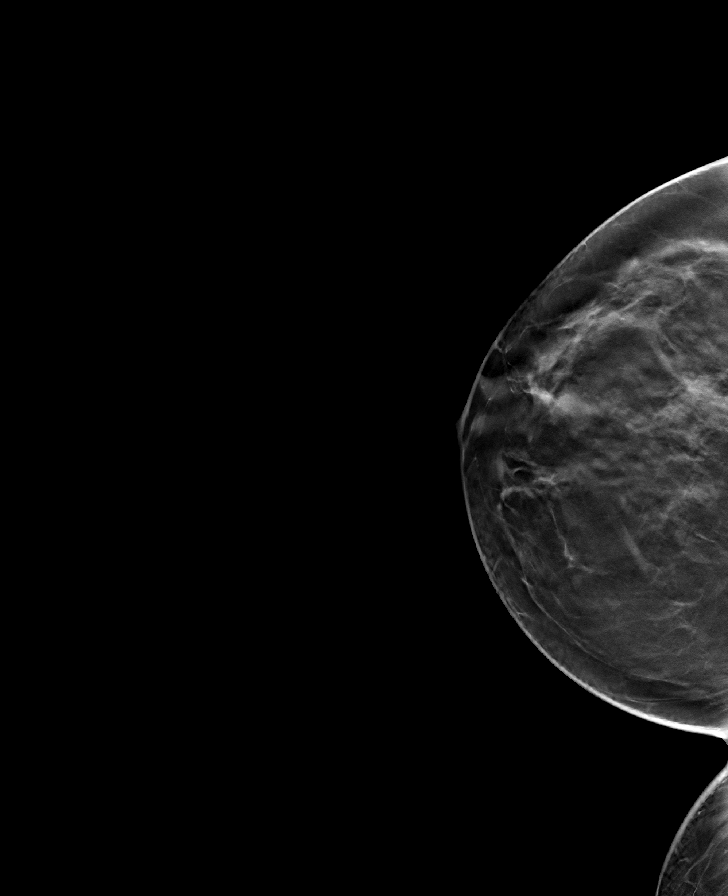

[R MLO tomo · tomo slice 41/82.0]
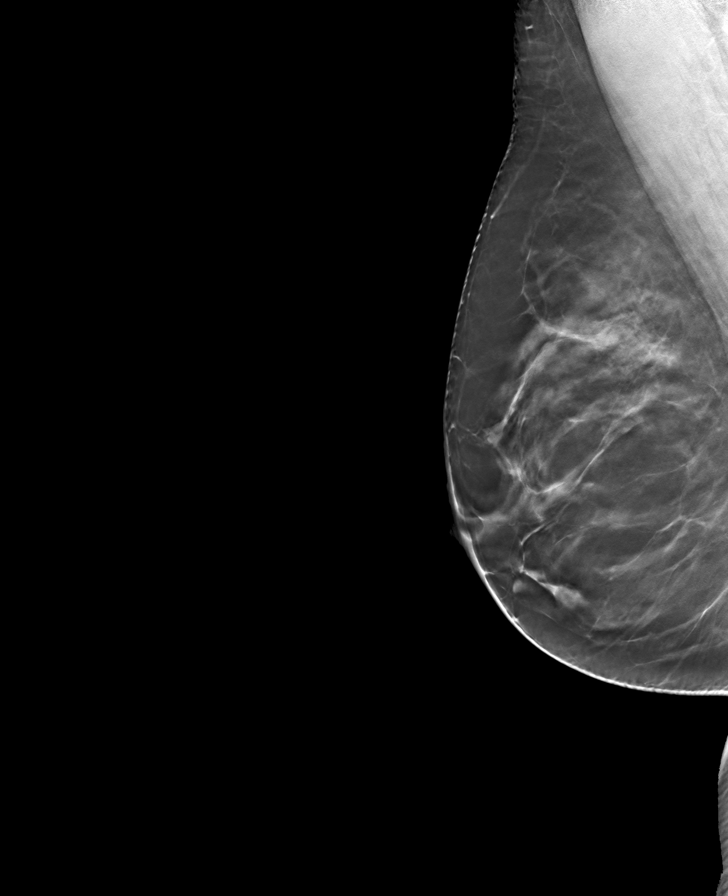

[L MLO tomo · tomo slice 41/82.0]
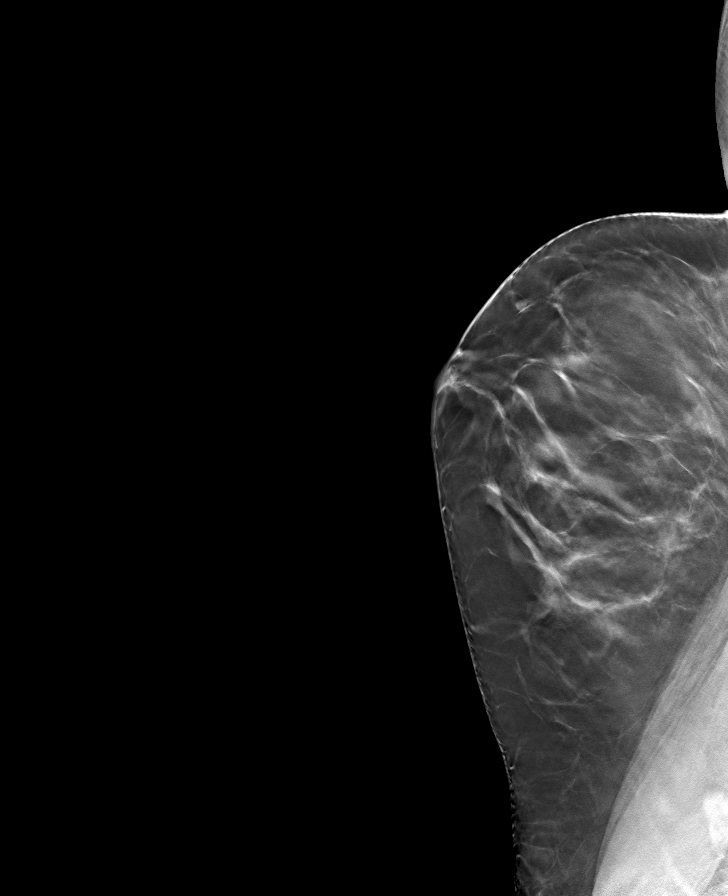

[L CC tomo · tomo slice 40/79.0]
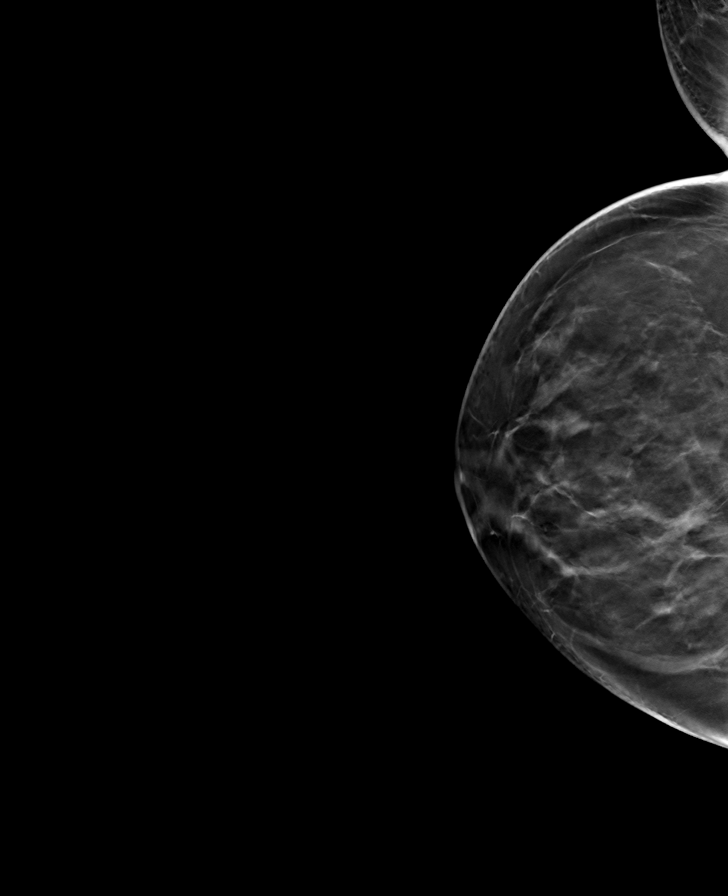

[8 of 24 positions shown; findings below may reference images not displayed]

ACR Breast Density Category c: The breast tissue is heterogeneously
dense, which may obscure small masses.
FINDINGS: There are no findings suspicious for malignancy. Images were
processed with CAD.
IMPRESSION: No mammographic evidence of malignancy. A result letter of this
screening mammogram will be mailed directly to the patient.

RECOMMENDATION:
Screening mammogram at age 40. (Code:KQ-[id number])

BI-RADS CATEGORY  1: Negative.

## 2021-04-11 DIAGNOSIS — Z31430 Encounter of female for testing for genetic disease carrier status for procreative management: Secondary | ICD-10-CM | POA: Diagnosis not present

## 2021-04-11 DIAGNOSIS — R69 Illness, unspecified: Secondary | ICD-10-CM | POA: Diagnosis not present

## 2021-04-11 DIAGNOSIS — Z9851 Tubal ligation status: Secondary | ICD-10-CM | POA: Diagnosis not present

## 2021-04-11 DIAGNOSIS — N979 Female infertility, unspecified: Secondary | ICD-10-CM | POA: Diagnosis not present

## 2021-04-14 ENCOUNTER — Other Ambulatory Visit: Payer: Self-pay | Admitting: Internal Medicine

## 2021-04-14 DIAGNOSIS — R69 Illness, unspecified: Secondary | ICD-10-CM | POA: Diagnosis not present

## 2021-04-14 DIAGNOSIS — N979 Female infertility, unspecified: Secondary | ICD-10-CM | POA: Diagnosis not present

## 2021-04-14 DIAGNOSIS — Z9851 Tubal ligation status: Secondary | ICD-10-CM | POA: Diagnosis not present

## 2021-04-14 DIAGNOSIS — Z31430 Encounter of female for testing for genetic disease carrier status for procreative management: Secondary | ICD-10-CM | POA: Diagnosis not present

## 2021-04-14 DIAGNOSIS — F4323 Adjustment disorder with mixed anxiety and depressed mood: Secondary | ICD-10-CM

## 2021-04-16 DIAGNOSIS — N979 Female infertility, unspecified: Secondary | ICD-10-CM | POA: Diagnosis not present

## 2021-04-16 DIAGNOSIS — Z31430 Encounter of female for testing for genetic disease carrier status for procreative management: Secondary | ICD-10-CM | POA: Diagnosis not present

## 2021-04-16 DIAGNOSIS — Z9851 Tubal ligation status: Secondary | ICD-10-CM | POA: Diagnosis not present

## 2021-04-16 DIAGNOSIS — R69 Illness, unspecified: Secondary | ICD-10-CM | POA: Diagnosis not present

## 2021-04-17 DIAGNOSIS — N979 Female infertility, unspecified: Secondary | ICD-10-CM | POA: Diagnosis not present

## 2021-05-07 DIAGNOSIS — N970 Female infertility associated with anovulation: Secondary | ICD-10-CM | POA: Diagnosis not present

## 2021-06-03 DIAGNOSIS — G44229 Chronic tension-type headache, not intractable: Secondary | ICD-10-CM | POA: Diagnosis not present

## 2021-06-20 ENCOUNTER — Telehealth: Payer: 59 | Admitting: Family

## 2021-06-20 ENCOUNTER — Encounter: Payer: Self-pay | Admitting: Family

## 2021-06-20 VITALS — Ht 62.0 in

## 2021-06-20 DIAGNOSIS — G43009 Migraine without aura, not intractable, without status migrainosus: Secondary | ICD-10-CM | POA: Diagnosis not present

## 2021-06-20 MED ORDER — VENLAFAXINE HCL ER 37.5 MG PO CP24
37.5000 mg | ORAL_CAPSULE | Freq: Every day | ORAL | 2 refills | Status: DC
Start: 2021-06-20 — End: 2021-10-01

## 2021-06-20 NOTE — Progress Notes (Signed)
Virtual Visit via Video Note  I connected with@  on 06/20/21 at  4:00 PM EDT by a video enabled telemedicine application and verified that I am speaking with the correct person using two identifiers.  Location patient: home Location provider:work  Persons participating in the virtual visit: patient, provider  I discussed the limitations of evaluation and management by telemedicine and the availability of in person appointments. The patient expressed understanding and agreed to proceed.   HPI: Migraine 10+ years ago, more rare at that time. Not worse HA of life.  Occurs every couple of months.Frequency increased.  HA feels similar to HA as the past.  Starts at base of neck and moves towards eyes. She will also have a different presentation it starts behind bilateral eyes. Endorses phonophobia , photophobia.  Triggered by strong odor/smell,  weather changes, stress HA is not worse by position or increased with pressure. She doesn't think HA  awaken her at night however she will awaken with headaches that she is not sure.   No fever, vision loss, changes in vision.  She denies posterior neck pain. No weakness, numbness in hands. Using adjustable desk.   Excredin or muscle relaxant helpful. imitrex 50mg  prn for rescue with relief. She tried propranolol in the past which was not effective. She would FMLA and to be backdated to early this year  Anxiety and depression feels well controlled.   ROS: See pertinent positives and negatives per HPI.    EXAM:  VITALS per patient if applicable: Ht 5\' 2"  (1.575 m)   BMI 23.56 kg/m  BP Readings from Last 3 Encounters:  01/17/21 110/70  09/30/20 118/70  06/20/20 100/78   Wt Readings from Last 3 Encounters:  01/17/21 128 lb 12.8 oz (58.4 kg)  09/30/20 125 lb 12.8 oz (57.1 kg)  06/20/20 125 lb 8 oz (56.9 kg)    GENERAL: alert, oriented, appears well and in no acute distress  HEENT: atraumatic, conjunttiva clear, no obvious  abnormalities on inspection of external nose and ears  NECK: normal movements of the head and neck  LUNGS: on inspection no signs of respiratory distress, breathing rate appears normal, no obvious gross SOB, gasping or wheezing  CV: no obvious cyanosis  MS: moves all visible extremities without noticeable abnormality  PSYCH/NEURO: pleasant and cooperative, no obvious depression or anxiety, speech and thought processing grossly intact  ASSESSMENT AND PLAN:  Discussed the following assessment and plan:  Problem List Items Addressed This Visit       Cardiovascular and Mediastinum   Migraine without aura - Primary    Headache has become more frequent.  It was brought into question if headache is awakening her from sleep.  In this setting, I will order MRI brain to exclude any pathologic reason for headache, and increase in frequency.  I have also asked her to stop Zoloft and start Effexor 37.5 mg.  We will monitor blood pressure at follow-up.  She will continue Imitrex 50 mg as needed.        Relevant Medications   venlafaxine XR (EFFEXOR XR) 37.5 MG 24 hr capsule   Other Relevant Orders   MR BRAIN W WO CONTRAST    -we discussed possible serious and likely etiologies, options for evaluation and workup, limitations of telemedicine visit vs in person visit, treatment, treatment risks and precautions. Pt prefers to treat via telemedicine empirically rather then risking or undertaking an in person visit at this moment.  .   I discussed the assessment and  treatment plan with the patient. The patient was provided an opportunity to ask questions and all were answered. The patient agreed with the plan and demonstrated an understanding of the instructions.   The patient was advised to call back or seek an in-person evaluation if the symptoms worsen or if the condition fails to improve as anticipated.   Mable Paris, FNP

## 2021-06-20 NOTE — Assessment & Plan Note (Signed)
Headache has become more frequent.  It was brought into question if headache is awakening her from sleep.  In this setting, I will order MRI brain to exclude any pathologic reason for headache, and increase in frequency.  I have also asked her to stop Zoloft and start Effexor 37.5 mg.  We will monitor blood pressure at follow-up.  She will continue Imitrex 50 mg as needed.

## 2021-06-20 NOTE — Patient Instructions (Signed)
Stop zoloft  Start effexor; we will monitor your blood pressure as we discussed.   I have ordered MRI brain  Let us know if you dont hear back within a week in regards to an appointment being scheduled.

## 2021-06-30 ENCOUNTER — Other Ambulatory Visit: Payer: Self-pay

## 2021-06-30 ENCOUNTER — Ambulatory Visit (HOSPITAL_COMMUNITY)
Admission: RE | Admit: 2021-06-30 | Discharge: 2021-06-30 | Disposition: A | Discharge status: Home / Self Care | Payer: 59 | Source: Ambulatory Visit | Attending: Family | Admitting: Family

## 2021-06-30 DIAGNOSIS — G43009 Migraine without aura, not intractable, without status migrainosus: Secondary | ICD-10-CM | POA: Diagnosis not present

## 2021-06-30 DIAGNOSIS — R519 Headache, unspecified: Secondary | ICD-10-CM | POA: Diagnosis not present

## 2021-06-30 DIAGNOSIS — G43909 Migraine, unspecified, not intractable, without status migrainosus: Secondary | ICD-10-CM | POA: Diagnosis not present

## 2021-06-30 MED ORDER — GADOBUTROL 1 MMOL/ML IV SOLN
6.5000 mL | Freq: Once | INTRAVENOUS | Status: AC | PRN
  Administered 2021-06-30: 6.5 mL via INTRAVENOUS

## 2021-07-01 ENCOUNTER — Telehealth: Payer: 59 | Admitting: Family

## 2021-07-02 ENCOUNTER — Telehealth: Payer: Self-pay | Admitting: Family

## 2021-07-02 NOTE — Telephone Encounter (Signed)
Patient dropped off FMLA paper work. Forms are up front in [facility]'s color folder.

## 2021-07-16 ENCOUNTER — Ambulatory Visit: Payer: 59 | Admitting: Dermatology

## 2021-07-16 ENCOUNTER — Other Ambulatory Visit: Payer: Self-pay

## 2021-07-16 DIAGNOSIS — L219 Seborrheic dermatitis, unspecified: Secondary | ICD-10-CM

## 2021-07-16 DIAGNOSIS — L813 Cafe au lait spots: Secondary | ICD-10-CM

## 2021-07-16 DIAGNOSIS — L578 Other skin changes due to chronic exposure to nonionizing radiation: Secondary | ICD-10-CM

## 2021-07-16 DIAGNOSIS — Z872 Personal history of diseases of the skin and subcutaneous tissue: Secondary | ICD-10-CM | POA: Diagnosis not present

## 2021-07-16 DIAGNOSIS — D229 Melanocytic nevi, unspecified: Secondary | ICD-10-CM

## 2021-07-16 DIAGNOSIS — D1800 Hemangioma unspecified site: Secondary | ICD-10-CM | POA: Diagnosis not present

## 2021-07-16 DIAGNOSIS — L821 Other seborrheic keratosis: Secondary | ICD-10-CM | POA: Diagnosis not present

## 2021-07-16 DIAGNOSIS — Z1283 Encounter for screening for malignant neoplasm of skin: Secondary | ICD-10-CM | POA: Diagnosis not present

## 2021-07-16 DIAGNOSIS — L814 Other melanin hyperpigmentation: Secondary | ICD-10-CM | POA: Diagnosis not present

## 2021-07-16 MED ORDER — KETOCONAZOLE 2 % EX SHAM: MEDICATED_SHAMPOO | CUTANEOUS | 5 refills | Status: AC

## 2021-07-16 MED ORDER — CLOBETASOL PROPIONATE 0.05 % EX SHAM: MEDICATED_SHAMPOO | CUTANEOUS | 5 refills | Status: AC

## 2021-07-16 NOTE — Progress Notes (Signed)
Follow-Up Visit   Subjective  Carolyn Chambers is a 40 y.o. female who presents for the following: Annual Exam (Hx of AK on the nose and seb derm of the scalp - patient currently using Clobetasol shampoo alternating with Ketoconazole shampoo and T-sal shampoo for seb derm. Well controlled on treatment. ). The patient presents for Total-Body Skin Exam (TBSE) for skin cancer screening and mole check.  The following portions of the chart were reviewed this encounter and updated as appropriate:   Tobacco  Allergies  Meds  Problems  Med Hx  Surg Hx  Fam Hx     Review of Systems:  No other skin or systemic complaints except as noted in HPI or Assessment and Plan.  Objective  Well appearing patient in no apparent distress; mood and affect are within normal limits.  A full examination was performed including scalp, head, eyes, ears, nose, lips, neck, chest, axillae, abdomen, back, buttocks, bilateral upper extremities, bilateral lower extremities, hands, feet, fingers, toes, fingernails, and toenails. All findings within normal limits unless otherwise noted below.  Scalp Mild scale of the scalp.   R post shoulder Brown macule.   Mid dorsum nose Clear.    Assessment & Plan  Seborrheic dermatitis Scalp Improved but not clear - not to goal  Seborrheic Dermatitis  -  is a chronic persistent [place] characterized by pinkness and scaling most commonly of the mid face but also can occur on the scalp (dandruff), ears; mid chest, mid back and groin.  It tends to be exacerbated by stress and cooler weather.  People who have neurologic disease may experience new onset or exacerbation of existing seborrheic dermatitis.  The condition is not curable but treatable and can be controlled.  Continue alternating Clobetasol shampoo, Ketoconazole shampoo, and T-Sal shampoo.   Topical steroids (such as triamcinolone, fluocinolone, fluocinonide, mometasone, clobetasol, halobetasol, betamethasone,  hydrocortisone) can cause thinning and lightening of the skin if they are used for too long in the same area. Your physician has selected the right strength medicine for your problem and area affected on the body. Please use your medication only as directed by your physician to prevent side effects.   Consider Calcipotriene foam if flaring in the future.   Related Medications ketoconazole (NIZORAL) 2 % shampoo Apply to scalp 3 times weekly, leave in for 5-10 minutes before rinsing alternating with clobetasol and T-Sal.  Clobetasol Propionate 0.05 % shampoo Apply to scalp and let sit a few minutes before rinsing alternating with ketoconazole and T-Sal  Cafe au lait spots R post shoulder Benign-appearing.  Observation.  Call clinic for new or changing lesions.  Recommend daily use of broad spectrum spf 30+ sunscreen to sun-exposed areas.   History of actinic keratosis Mid dorsum nose Clear. Observe for recurrence. Call clinic for new or changing lesions.  Recommend regular skin exams, daily broad-spectrum spf 30+ sunscreen use, and photoprotection.    Skin cancer screening  Lentigines - Scattered tan macules - Due to sun exposure - Benign-appearing, observe - Recommend daily broad spectrum sunscreen SPF 30+ to sun-exposed areas, reapply every 2 hours as needed. - Call for any changes  Seborrheic Keratoses - Stuck-on, waxy, tan-brown papules and/or plaques  - Benign-appearing - Discussed benign etiology and prognosis. - Observe - Call for any changes  Melanocytic Nevi - Tan-brown and/or pink-flesh-colored symmetric macules and papules - Benign appearing on exam today - Observation - Call clinic for new or changing moles - Recommend daily use of broad spectrum spf 30+  sunscreen to sun-exposed areas.   Hemangiomas - Red papules - Discussed benign nature - Observe - Call for any changes  Actinic Damage - Chronic condition, secondary to cumulative UV/sun exposure - diffuse  scaly erythematous macules with underlying dyspigmentation - Recommend daily broad spectrum sunscreen SPF 30+ to sun-exposed areas, reapply every 2 hours as needed.  - Staying in the shade or wearing long sleeves, sun glasses (UVA+UVB protection) and wide brim hats (4-inch brim around the entire circumference of the hat) are also recommended for sun protection.  - Call for new or changing lesions.  Skin cancer screening performed today.  Return in about 1 year (around 07/16/2022) for TBSE.  Luther Redo, CMA, am acting as scribe for Sarina Ser, MD . Documentation: I have reviewed the above documentation for accuracy and completeness, and I agree with the above.  Sarina Ser, MD

## 2021-07-16 NOTE — Patient Instructions (Signed)

## 2021-07-20 ENCOUNTER — Encounter: Payer: Self-pay | Admitting: Dermatology

## 2021-10-01 ENCOUNTER — Encounter: Payer: Self-pay | Admitting: Family

## 2021-10-01 ENCOUNTER — Other Ambulatory Visit: Payer: Self-pay

## 2021-10-01 ENCOUNTER — Ambulatory Visit (INDEPENDENT_AMBULATORY_CARE_PROVIDER_SITE_OTHER): Payer: 59 | Admitting: Family

## 2021-10-01 VITALS — BP 108/66 | HR 85 | Temp 98.3°F | Ht 62.0 in | Wt 134.4 lb

## 2021-10-01 DIAGNOSIS — G43009 Migraine without aura, not intractable, without status migrainosus: Secondary | ICD-10-CM

## 2021-10-01 DIAGNOSIS — Z0000 Encounter for general adult medical examination without abnormal findings: Secondary | ICD-10-CM | POA: Diagnosis not present

## 2021-10-01 NOTE — Progress Notes (Signed)
Subjective:    Patient ID: Carolyn Chambers, female    DOB: [birth year], 41 y.o.   MRN: [id number]  CC: Carolyn Chambers is a 41 y.o. female who presents today for physical exam.    HPI: She feels well today No concerns  Migraines have reduced 'considerably' , since improved since reduced stress.  Intensity and frequency has significantly reduced.  She may occasionally get a headache when the weather changes .She has no further concern for HA. She is no longer on effexor.   She follows with Dr Carolyn Chambers annually.   Colorectal Cancer Screening: No changes to bowel habits.  No first-degree relative history of colon cancer Breast Cancer Screening: Mammogram due Cervical Cancer Screening: 09/30/20 showed LSIL. She discussed with Dr Carolyn Chambers, GYN, [facility], whom recommended repeat in one year. Plans to reschedule.    Lung Cancer Screening: Doesn't have 20 year pack year history and age > 78 years yo 6 years       Tetanus - UTD         Labs: Screening labs today. Exercise: Gets regular exercise, walks couple of times per week. No sob, cp.   Alcohol use:  occasionally Smoking/tobacco use: former smoker.     HISTORY:  Past Medical History:  Diagnosis Date   Dysmenorrhea    Migraines     Past Surgical History:  Procedure Laterality Date   TUBAL LIGATION  2005   Family History  Problem Relation Age of Onset   Hypertension Father    ADD / ADHD Brother    Emphysema Maternal Grandmother    Alcohol abuse Maternal Grandfather    Liver cancer Maternal Grandfather    Colon cancer Maternal Grandfather 36   Colon polyps Maternal Grandfather    Heart attack Paternal Grandfather    Diabetes Paternal Grandfather    Hypertension Paternal Grandfather    Skin cancer Mother    Anxiety disorder Mother    Depression Mother    Breast cancer Neg Hx    Esophageal cancer Neg Hx    Pancreatic cancer Neg Hx    Stomach cancer Neg Hx       ALLERGIES: Patient has no known  allergies.  Current Outpatient Medications on File Prior to Visit  Medication Sig Dispense Refill   aspirin-acetaminophen-caffeine (EXCEDRIN MIGRAINE) 250-250-65 MG tablet Take by mouth every 6 (six) hours as needed for headache.     cetirizine (ZYRTEC) 10 MG tablet Take 10 mg by mouth daily as needed.      Clobetasol Propionate 0.05 % shampoo Apply to scalp and let sit a few minutes before rinsing alternating with ketoconazole and T-Sal 118 mL 5   cyclobenzaprine (FLEXERIL) 10 MG tablet Take 1 tablet (10 mg total) by mouth 2 (two) times daily as needed for muscle spasms. 30 tablet 2   FIBER PO Take 1 capsule by mouth daily.     ketoconazole (NIZORAL) 2 % shampoo Apply to scalp 3 times weekly, leave in for 5-10 minutes before rinsing alternating with clobetasol and T-Sal. 120 mL 5   omeprazole (PRILOSEC) 20 MG capsule TAKE 1 CAPSULE BY MOUTH PRN 90 capsule 1   No current facility-administered medications on file prior to visit.    Social History   Tobacco Use   Smoking status: Former    Packs/day: 1.00    Years: 20.00    Pack years: 20.00    Types: Cigarettes   Smokeless tobacco: Never   Tobacco comments:    quit 09/01/2012  Vaping Use   Vaping Use: Never used  Substance Use Topics   Alcohol use: Yes    Alcohol/week: 1.0 standard drink    Types: 1 Shots of liquor per week    Comment: occasionally   Drug use: No    Review of Systems  Constitutional:  Negative for chills, fever and unexpected weight change.  HENT:  Negative for congestion.   Respiratory:  Negative for cough.   Cardiovascular:  Negative for chest pain, palpitations and leg swelling.  Gastrointestinal:  Negative for nausea and vomiting.  Musculoskeletal:  Negative for arthralgias and myalgias.  Skin:  Negative for Gracie.  Neurological:  Negative for headaches (resolved).  Hematological:  Negative for adenopathy.  Psychiatric/Behavioral:  Negative for confusion.      Objective:    BP 108/66 (BP Location:  Left Arm, Patient Position: Sitting, Cuff Size: Normal)    Pulse 85    Temp 98.3 F (36.8 C) (Oral)    Ht 5\' 2"  (1.575 m)    Wt 134 lb 6.4 oz (61 kg)    SpO2 99%    BMI 24.58 kg/m   BP Readings from Last 3 Encounters:  10/01/21 108/66  01/17/21 110/70  09/30/20 118/70   Wt Readings from Last 3 Encounters:  10/01/21 134 lb 6.4 oz (61 kg)  01/17/21 128 lb 12.8 oz (58.4 kg)  09/30/20 125 lb 12.8 oz (57.1 kg)    Physical Exam Vitals reviewed.  Constitutional:      Appearance: Normal appearance. She is well-developed.  Eyes:     Conjunctiva/sclera: Conjunctivae normal.  Neck:     Thyroid: No thyroid mass or thyromegaly.  Cardiovascular:     Rate and Rhythm: Normal rate and regular rhythm.     Pulses: Normal pulses.     Heart sounds: Normal heart sounds.  Pulmonary:     Effort: Pulmonary effort is normal.     Breath sounds: Normal breath sounds. No wheezing, rhonchi or rales.  Chest:  Breasts:    Breasts are symmetrical.     Right: No inverted nipple, mass, nipple discharge, skin change or tenderness.     Left: No inverted nipple, mass, nipple discharge, skin change or tenderness.  Abdominal:     General: Bowel sounds are normal. There is no distension.     Palpations: Abdomen is soft. Abdomen is not rigid. There is no fluid wave or mass.     Tenderness: There is no abdominal tenderness. There is no guarding or rebound.  Lymphadenopathy:     Head:     Right side of head: No submental, submandibular, tonsillar, preauricular, posterior auricular or occipital adenopathy.     Left side of head: No submental, submandibular, tonsillar, preauricular, posterior auricular or occipital adenopathy.     Cervical: No cervical adenopathy.     Right cervical: No superficial, deep or posterior cervical adenopathy.    Left cervical: No superficial, deep or posterior cervical adenopathy.  Skin:    General: Skin is warm and dry.  Neurological:     Mental Status: She is alert.  Psychiatric:         Speech: Speech normal.        Behavior: Behavior normal.        Thought Content: Thought content normal.       Assessment & Plan:   Problem List Items Addressed This Visit       Cardiovascular and Mediastinum   Migraine without aura    Much improved.  Patient no longer  concerned with headache.  No further evaluation at this        Other   Routine physical examination - Primary    Clinical breast exam performed today.  Mammogram has been ordered.  Patient will schedule this.  Patient prefers to follow-up with GYN, [facility], for history of abnormal Pap smear.  She will call to schedule this appointment and ensure that office visit notes, Pap results are faxed to this office.  Encourage continued exercise.       Relevant Orders   VITAMIN D 25 Hydroxy (Vit-D Deficiency, Fractures)   Hemoglobin A1c   TSH   CBC with Differential/Platelet   Comprehensive metabolic panel   Lipid panel   MM 3D SCREEN BREAST BILATERAL     I have discontinued Kash Batts's SUMAtriptan and venlafaxine XR. I am also having her maintain her cetirizine, aspirin-acetaminophen-caffeine, FIBER PO, omeprazole, cyclobenzaprine, ketoconazole, and Clobetasol Propionate.   No orders of the defined types were placed in this encounter.   Return precautions given.   Risks, benefits, and alternatives of the medications and treatment plan prescribed today were discussed, and patient expressed understanding.   Education regarding symptom management and diagnosis given to patient on AVS.   Continue to follow with Burnard Hawthorne, FNP for routine health maintenance.   Oasis Fuhriman and I agreed with plan.   Mable Paris, FNP

## 2021-10-01 NOTE — Assessment & Plan Note (Signed)
Much improved.  Patient no longer concerned with headache.  No further evaluation at this

## 2021-10-01 NOTE — Patient Instructions (Signed)
Please ensure follow up with GYN regarding pap smear and have them send me results/ new pap smear   Please call  and schedule your 3D mammogram    [facility]  [place], [place]   Health Maintenance, Female Adopting a healthy lifestyle and getting preventive care are important in promoting health and wellness. Ask your health care provider about: The right schedule for you to have regular tests and exams. Things you can do on your own to prevent diseases and keep yourself healthy. What should I know about diet, weight, and exercise? Eat a healthy diet  Eat a diet that includes plenty of vegetables, fruits, low-fat dairy products, and lean protein. Do not eat a lot of foods that are high in solid fats, added sugars, or sodium. Maintain a healthy weight Body mass index (BMI) is used to identify weight problems. It estimates body fat based on height and weight. Your health care provider can help determine your BMI and help you achieve or maintain a healthy weight. Get regular exercise Get regular exercise. This is one of the most important things you can do for your health. Most adults should: Exercise for at least 150 minutes each week. The exercise should increase your heart rate and make you sweat (moderate-intensity exercise). Do strengthening exercises at least twice a week. This is in addition to the moderate-intensity exercise. Spend less time sitting. Even light physical activity can be beneficial. Watch cholesterol and blood lipids Have your blood tested for lipids and cholesterol at 41 years of age, then have this test every 5 years. Have your cholesterol levels checked more often if: Your lipid or cholesterol levels are high. You are older than 41 years of age. You are at high risk for heart disease. What should I know about cancer screening? Depending on your health history and family history, you may need to have cancer  screening at various ages. This may include screening for: Breast cancer. Cervical cancer. Colorectal cancer. Skin cancer. Lung cancer. What should I know about heart disease, diabetes, and high blood pressure? Blood pressure and heart disease High blood pressure causes heart disease and increases the risk of stroke. This is more likely to develop in people who have high blood pressure readings or are overweight. Have your blood pressure checked: Every 3-5 years if you are 57-65 years of age. Every year if you are 34 years old or older. Diabetes Have regular diabetes screenings. This checks your fasting blood sugar level. Have the screening done: Once every three years after age 64 if you are at a normal weight and have a low risk for diabetes. More often and at a younger age if you are overweight or have a high risk for diabetes. What should I know about preventing infection? Hepatitis B If you have a higher risk for hepatitis B, you should be screened for this virus. Talk with your health care provider to find out if you are at risk for hepatitis B infection. Hepatitis C Testing is recommended for: Everyone born from [birth year] through 1965. Anyone with known risk factors for hepatitis C. Sexually transmitted infections (STIs) Get screened for STIs, including gonorrhea and chlamydia, if: You are sexually active and are younger than 41 years of age. You are older than 41 years of age and your health care provider tells you that you are at risk for this type of infection. Your sexual activity has changed since you were last screened,  and you are at increased risk for chlamydia or gonorrhea. Ask your health care provider if you are at risk. Ask your health care provider about whether you are at high risk for HIV. Your health care provider may recommend a prescription medicine to help prevent HIV infection. If you choose to take medicine to prevent HIV, you should first get tested for HIV. You  should then be tested every 3 months for as long as you are taking the medicine. Pregnancy If you are about to stop having your period (premenopausal) and you may become pregnant, seek counseling before you get pregnant. Take 400 to 800 micrograms (mcg) of folic acid every day if you become pregnant. Ask for birth control (contraception) if you want to prevent pregnancy. Osteoporosis and menopause Osteoporosis is a disease in which the bones lose minerals and strength with aging. This can result in bone fractures. If you are 85 years old or older, or if you are at risk for osteoporosis and fractures, ask your health care provider if you should: Be screened for bone loss. Take a calcium or vitamin D supplement to lower your risk of fractures. Be given hormone replacement therapy (HRT) to treat symptoms of menopause. Follow these instructions at home: Alcohol use Do not drink alcohol if: Your health care provider tells you not to drink. You are pregnant, may be pregnant, or are planning to become pregnant. If you drink alcohol: Limit how much you have to: 0-1 drink a day. Know how much alcohol is in your drink. In the [place] one drink equals one 12 oz bottle of beer (355 mL), one 5 oz glass of wine (148 mL), or one 1 oz glass of hard liquor (44 mL). Lifestyle Do not use any products that contain nicotine or tobacco. These products include cigarettes, chewing tobacco, and vaping devices, such as e-cigarettes. If you need help quitting, ask your health care provider. Do not use street drugs. Do not share needles. Ask your health care provider for help if you need support or information about quitting drugs. General instructions Schedule regular health, dental, and eye exams. Stay current with your vaccines. Tell your health care provider if: You often feel depressed. You have ever been abused or do not feel safe at home. Summary Adopting a healthy lifestyle and getting preventive care are  important in promoting health and wellness. Follow your health care provider's instructions about healthy diet, exercising, and getting tested or screened for diseases. Follow your health care provider's instructions on monitoring your cholesterol and blood pressure. This information is not intended to replace advice given to you by your health care provider. Make sure you discuss any questions you have with your health care provider. Document Revised: 01/06/2021 Document Reviewed: 01/06/2021 Elsevier Patient Education  [place].

## 2021-10-01 NOTE — Assessment & Plan Note (Signed)
Clinical breast exam performed today.  Mammogram has been ordered.  Patient will schedule this.  Patient prefers to follow-up with GYN, [facility], for history of abnormal Pap smear.  She will call to schedule this appointment and ensure that office visit notes, Pap results are faxed to this office.  Encourage continued exercise.

## 2021-10-02 ENCOUNTER — Other Ambulatory Visit: Payer: Self-pay | Admitting: Family

## 2021-10-02 DIAGNOSIS — Z1231 Encounter for screening mammogram for malignant neoplasm of breast: Secondary | ICD-10-CM

## 2021-10-03 ENCOUNTER — Ambulatory Visit
Admission: RE | Admit: 2021-10-03 | Discharge: 2021-10-03 | Disposition: A | Discharge status: Home / Self Care | Payer: 59 | Source: Ambulatory Visit

## 2021-10-03 ENCOUNTER — Other Ambulatory Visit: Payer: Self-pay

## 2021-10-03 DIAGNOSIS — Z1231 Encounter for screening mammogram for malignant neoplasm of breast: Secondary | ICD-10-CM

## 2021-10-07 ENCOUNTER — Other Ambulatory Visit: Payer: Self-pay | Admitting: Family

## 2021-10-07 DIAGNOSIS — R928 Other abnormal and inconclusive findings on diagnostic imaging of breast: Secondary | ICD-10-CM

## 2021-10-09 ENCOUNTER — Telehealth: Payer: Self-pay

## 2021-10-09 NOTE — Telephone Encounter (Signed)
LMTCB for mammo results.  

## 2021-10-16 ENCOUNTER — Other Ambulatory Visit: Payer: Self-pay

## 2021-10-16 ENCOUNTER — Other Ambulatory Visit (INDEPENDENT_AMBULATORY_CARE_PROVIDER_SITE_OTHER): Payer: 59

## 2021-10-16 DIAGNOSIS — Z0000 Encounter for general adult medical examination without abnormal findings: Secondary | ICD-10-CM | POA: Diagnosis not present

## 2021-10-16 LAB — CBC WITH DIFFERENTIAL/PLATELET
Basophils Absolute: 0 10*3/uL (ref 0.0–0.1)
Basophils Relative: 0.5 % (ref 0.0–3.0)
Eosinophils Absolute: 0.1 10*3/uL (ref 0.0–0.7)
Eosinophils Relative: 0.7 % (ref 0.0–5.0)
HCT: 41.2 % (ref 36.0–46.0)
Hemoglobin: 13.7 g/dL (ref 12.0–15.0)
Lymphocytes Relative: 27.9 % (ref 12.0–46.0)
Lymphs Abs: 2 10*3/uL (ref 0.7–4.0)
MCHC: 33.3 g/dL (ref 30.0–36.0)
MCV: 91.8 fl (ref 78.0–100.0)
Monocytes Absolute: 0.5 10*3/uL (ref 0.1–1.0)
Monocytes Relative: 6.5 % (ref 3.0–12.0)
Neutro Abs: 4.6 10*3/uL (ref 1.4–7.7)
Neutrophils Relative %: 64.4 % (ref 43.0–77.0)
Platelets: 228 10*3/uL (ref 150.0–400.0)
RBC: 4.49 Mil/uL (ref 3.87–5.11)
RDW: 13.3 % (ref 11.5–15.5)
WBC: 7.2 10*3/uL (ref 4.0–10.5)

## 2021-10-16 LAB — HEMOGLOBIN A1C: Hgb A1c MFr Bld: 5.5 % (ref 4.6–6.5)

## 2021-10-16 LAB — COMPREHENSIVE METABOLIC PANEL
ALT: 11 U/L (ref 0–35)
AST: 16 U/L (ref 0–37)
Albumin: 4.4 g/dL (ref 3.5–5.2)
Alkaline Phosphatase: 54 U/L (ref 39–117)
BUN: 11 mg/dL (ref 6–23)
CO2: 30 mEq/L (ref 19–32)
Calcium: 8.9 mg/dL (ref 8.4–10.5)
Chloride: 105 mEq/L (ref 96–112)
Creatinine, Ser: 0.61 mg/dL (ref 0.40–1.20)
GFR: 111.67 mL/min (ref >60.00)
Glucose, Bld: 100 mg/dL — ABNORMAL HIGH (ref 70–99)
Potassium: 4 mEq/L (ref 3.5–5.1)
Sodium: 138 mEq/L (ref 135–145)
Total Bilirubin: 0.6 mg/dL (ref 0.2–1.2)
Total Protein: 6.7 g/dL (ref 6.0–8.3)

## 2021-10-16 LAB — LIPID PANEL
Cholesterol: 182 mg/dL (ref 0–200)
HDL: 46.5 mg/dL (ref >39.00)
LDL Cholesterol: 124 mg/dL — ABNORMAL HIGH (ref 0–99)
NonHDL: 135.66
Total CHOL/HDL Ratio: 4
Triglycerides: 57 mg/dL (ref 0.0–149.0)
VLDL: 11.4 mg/dL (ref 0.0–40.0)

## 2021-10-16 LAB — TSH: TSH: 1.01 u[IU]/mL (ref 0.35–5.50)

## 2021-10-16 LAB — VITAMIN D 25 HYDROXY (VIT D DEFICIENCY, FRACTURES): VITD: 15.66 ng/mL — ABNORMAL LOW (ref 30.00–100.00)

## 2021-10-18 ENCOUNTER — Other Ambulatory Visit: Payer: Self-pay | Admitting: Family

## 2021-10-18 DIAGNOSIS — E559 Vitamin D deficiency, unspecified: Secondary | ICD-10-CM

## 2021-10-18 MED ORDER — CHOLECALCIFEROL 1.25 MG (50000 UT) PO TABS: ORAL_TABLET | ORAL | 0 refills | Status: AC

## 2021-10-27 ENCOUNTER — Ambulatory Visit
Admission: RE | Admit: 2021-10-27 | Discharge: 2021-10-27 | Disposition: A | Discharge status: Home / Self Care | Payer: 59 | Source: Ambulatory Visit | Attending: Family | Admitting: Family

## 2021-10-27 ENCOUNTER — Other Ambulatory Visit: Payer: Self-pay

## 2021-10-27 DIAGNOSIS — R928 Other abnormal and inconclusive findings on diagnostic imaging of breast: Secondary | ICD-10-CM

## 2021-10-27 DIAGNOSIS — R922 Inconclusive mammogram: Secondary | ICD-10-CM | POA: Diagnosis not present

## 2021-11-07 ENCOUNTER — Telehealth: Payer: Self-pay | Admitting: Physician Assistant

## 2021-11-07 NOTE — Telephone Encounter (Signed)
Spoke with the patient. She states she has a sudden onset of painful bowel movement and rectal pain last week. It is getting worse. There is blood with bowel movements, bulging and protrusion she can feel when she tried to clean. She dreads the bowel movement because of the pain. She has a history of hemorrhoids and is familiar with personal care and support measures. This time seems to be more painful. She is on stool softeners, using OTC and suppositories. She has only seen Amy Esterwood in 2021. Scheduled in the first available opening.  ?

## 2021-11-07 NOTE — Telephone Encounter (Signed)
Patient called and stated that she is having issues with hemorrhoids and could not wait until Amy Esterwoods next appointment on 3/21. Seeking advice as to what she can do. Please advise.  ?

## 2021-11-12 ENCOUNTER — Encounter: Payer: Self-pay | Admitting: Physician Assistant

## 2021-11-12 ENCOUNTER — Ambulatory Visit: Payer: 59 | Admitting: Physician Assistant

## 2021-11-12 VITALS — BP 98/68 | HR 76 | Ht 62.0 in | Wt 132.6 lb

## 2021-11-12 DIAGNOSIS — K6289 Other specified diseases of anus and rectum: Secondary | ICD-10-CM

## 2021-11-12 DIAGNOSIS — K645 Perianal venous thrombosis: Secondary | ICD-10-CM

## 2021-11-12 MED ORDER — HYDROCORTISONE 1 % EX OINT: 1.0000 "application " | TOPICAL_OINTMENT | Freq: Two times a day (BID) | CUTANEOUS | 0 refills | Status: AC

## 2021-11-12 NOTE — Progress Notes (Signed)
Attending Physician's Attestation  ? ?I have reviewed the chart.  ? ?I agree with the Advanced Practitioner's note, impression, and recommendations with any updates as below. ?Urgent evaluation is very reasonable and appreciate our [place] surgeons being available.  At some point probably need to consider a diagnostic colonoscopy to ensure no other issues and better define the overall hemorrhoidal disease burden. ? ? ?Justice Britain, MD ?[facility] ?Advanced Endoscopy ?Office # [phone] ? ?

## 2021-11-12 NOTE — Progress Notes (Signed)
? ?Chief Complaint: Rectal pain ? ?HPI: ?   Carolyn Chambers is a 41 year old female with a past medical history as listed below, known to Dr. Rush Landmark, who presents to clinic today for rectal pain. ?   06/20/2020 patient seen in clinic for follow-up of hemorrhoid which had previously been treated 05/21/2020 and August 2021 with Anusol twice daily.  Symptoms had resolved.  She was continued on a daily fiber supplement and told she could stop regular use of Anusol HC cream and resume if needed for recurrent symptoms. ?   11/07/2021 patient describes sudden onset of painful bowel movement rectal pain a week prior with some blood with bowel movements bulging or protrusion she could feel when she tried to clean. ?   Today, the patient tells me that about 7 to 10 days ago now she woke up and felt like she had some discomfort at her rectum, she went to have a bowel movement and had a protrusion come out, she is used to this as she has a hemorrhoid which has never fully gone away, but this proceeded to grow and grow over the past week and has become more and more painful.  Tells me she is taking a stool softener to keep her stool soft, she has been trying all the over-the-counter creams but nothing is really helping.  She tried sitz bath's which have not been relieving the pain.  Describes discomfort even with just sitting, but this is made much worse when she is defecating. ?   Denies fever, chills, weight loss, nausea or vomiting. ? ?Past Medical History:  ?Diagnosis Date  ? Dysmenorrhea   ? Migraines   ? ? ?Past Surgical History:  ?Procedure Laterality Date  ? TUBAL LIGATION  2005  ? ? ?Current Outpatient Medications  ?Medication Sig Dispense Refill  ? aspirin-acetaminophen-caffeine (EXCEDRIN MIGRAINE) 250-250-65 MG tablet Take by mouth every 6 (six) hours as needed for headache.    ? cetirizine (ZYRTEC) 10 MG tablet Take 10 mg by mouth daily as needed.     ? Cholecalciferol 1.25 MG (50000 UT) TABS 50,000 units PO qwk for 8  weeks. 8 tablet 0  ? Clobetasol Propionate 0.05 % shampoo Apply to scalp and let sit a few minutes before rinsing alternating with ketoconazole and T-Sal 118 mL 5  ? cyclobenzaprine (FLEXERIL) 10 MG tablet Take 1 tablet (10 mg total) by mouth 2 (two) times daily as needed for muscle spasms. 30 tablet 2  ? FIBER PO Take 1 capsule by mouth daily.    ? ketoconazole (NIZORAL) 2 % shampoo Apply to scalp 3 times weekly, leave in for 5-10 minutes before rinsing alternating with clobetasol and T-Sal. 120 mL 5  ? omeprazole (PRILOSEC) 20 MG capsule TAKE 1 CAPSULE BY MOUTH PRN 90 capsule 1  ? ?No current facility-administered medications for this visit.  ? ? ?Allergies as of 11/12/2021  ? (No Known Allergies)  ? ? ?Family History  ?Problem Relation Age of Onset  ? Hypertension Father   ? ADD / ADHD Brother   ? Emphysema Maternal Grandmother   ? Alcohol abuse Maternal Grandfather   ? Liver cancer Maternal Grandfather   ? Colon cancer Maternal Grandfather 21  ? Colon polyps Maternal Grandfather   ? Heart attack Paternal Grandfather   ? Diabetes Paternal Grandfather   ? Hypertension Paternal Grandfather   ? Skin cancer Mother   ? Anxiety disorder Mother   ? Depression Mother   ? Breast cancer Neg Hx   ?  Esophageal cancer Neg Hx   ? Pancreatic cancer Neg Hx   ? Stomach cancer Neg Hx   ? ? ?Social History  ? ?Socioeconomic History  ? Marital status: Divorced  ?  Spouse name: Not on file  ? Number of children: 2  ? Years of education: Not on file  ? Highest education level: Not on file  ?Occupational History  ? Occupation: Haematologist  ?Tobacco Use  ? Smoking status: Former  ?  Packs/day: 1.00  ?  Years: 20.00  ?  Pack years: 20.00  ?  Types: Cigarettes  ? Smokeless tobacco: Never  ? Tobacco comments:  ?  quit 09/01/2012  ?Vaping Use  ? Vaping Use: Never used  ?Substance and Sexual Activity  ? Alcohol use: Yes  ?  Alcohol/week: 1.0 standard drink  ?  Types: 1 Shots of liquor per week  ?  Comment: occasionally  ? Drug  use: No  ? Sexual activity: Yes  ?  Partners: Male  ?  Birth control/protection: Surgical  ?Other Topics Concern  ? Not on file  ?Social History Narrative  ? 69 and 8 yo kids.   ? ?Social Determinants of Health  ? ?Financial Resource Strain: Not on file  ?Food Insecurity: Not on file  ?Transportation Needs: Not on file  ?Physical Activity: Not on file  ?Stress: Not on file  ?Social Connections: Not on file  ?Intimate Partner Violence: Not on file  ? ? ?Review of Systems:    ?Constitutional: No weight loss, fever or chills ?Cardiovascular: No chest pain ?Respiratory: No SOB  ?Gastrointestinal: See HPI and otherwise negative ? ? Physical Exam:  ?Vital signs: ?BP 98/68   Pulse 76   Ht '5\' 2"'$  (1.575 m)   Wt 132 lb 9.6 oz (60.1 kg)   BMI 24.25 kg/m?   ? ?Constitutional:   Pleasant Caucasian female appears to be in NAD, Well developed, Well nourished, alert and cooperative ?Respiratory: Respirations even and unlabored. Lungs clear to auscultation bilaterally.   No wheezes, crackles, or rhonchi.  ?Cardiovascular: Normal S1, S2. No MRG. Regular rate and rhythm. No peripheral edema, cyanosis or pallor.  ?Gastrointestinal:  Soft, nondistended, nontender. No rebound or guarding. Normal bowel sounds. No appreciable masses or hepatomegaly. ?Rectal: External: Large thrombosed external hemorrhoid tender to palpation; internal: Deferred due to pain, no visible fissure ?Psychiatric: Oriented to person, place and time. Demonstrates good judgement and reason without abnormal affect or behaviors. ? ?RELEVANT LABS AND IMAGING: ?CBC ?   ?Component Value Date/Time  ? WBC 7.2 10/16/2021 0936  ? RBC 4.49 10/16/2021 0936  ? HGB 13.7 10/16/2021 0936  ? HGB 14.1 07/31/2014 1701  ? HCT 41.2 10/16/2021 0936  ? HCT 42.4 07/31/2014 1701  ? PLT 228.0 10/16/2021 0936  ? PLT 239 07/31/2014 1701  ? MCV 91.8 10/16/2021 0936  ? MCV 91 07/31/2014 1701  ? MCH 30.2 07/31/2014 1701  ? MCHC 33.3 10/16/2021 0936  ? RDW 13.3 10/16/2021 0936  ? RDW 13.3  07/31/2014 1701  ? LYMPHSABS 2.0 10/16/2021 0936  ? MONOABS 0.5 10/16/2021 0936  ? EOSABS 0.1 10/16/2021 0936  ? BASOSABS 0.0 10/16/2021 0936  ? ? ?CMP  ?   ?Component Value Date/Time  ? NA 138 10/16/2021 0936  ? NA 138 07/31/2014 1701  ? K 4.0 10/16/2021 0936  ? K 3.9 07/31/2014 1701  ? CL 105 10/16/2021 0936  ? CL 107 07/31/2014 1701  ? CO2 30 10/16/2021 0936  ? CO2 24 07/31/2014  1701  ? GLUCOSE 100 (H) 10/16/2021 0936  ? GLUCOSE 114 (H) 07/31/2014 1701  ? BUN 11 10/16/2021 0936  ? BUN 10 07/31/2014 1701  ? CREATININE 0.61 10/16/2021 0936  ? CREATININE 0.72 07/31/2014 1701  ? CALCIUM 8.9 10/16/2021 0936  ? CALCIUM 8.4 (L) 07/31/2014 1701  ? PROT 6.7 10/16/2021 0936  ? PROT 7.0 07/31/2014 1701  ? ALBUMIN 4.4 10/16/2021 0936  ? ALBUMIN 3.7 07/31/2014 1701  ? AST 16 10/16/2021 0936  ? AST 21 07/31/2014 1701  ? ALT 11 10/16/2021 0936  ? ALT 20 07/31/2014 1701  ? ALKPHOS 54 10/16/2021 0936  ? ALKPHOS 85 07/31/2014 1701  ? BILITOT 0.6 10/16/2021 0936  ? BILITOT 0.3 07/31/2014 1701  ? GFRNONAA >60 07/31/2014 1701  ? GFRAA >60 07/31/2014 1701  ? ? ?Assessment: ?1.  Thrombosed external hemorrhoid: Worsened over the past week, no help from over-the-counter creams or sitz bath's, very tender at time of exam ? ?Plan: ?1.  Sent urgent referral to surgical team for I&D.  We called at time of patient's appointment and they were able to fit the patient in at 1015 tomorrow morning.  She was made an appointment. ?2.  Prescribed Hydrocortisone ointment to be applied twice daily x7 -14 days. ?3.  Recommend the patient do sitz bath's for 15 to 20 minutes 2-3 times a day. ?4.  Recommend patient buy over-the-counter RectiCare lidocaine apply as needed ?5.  Continue stool softener to maintain soft bowel movements. ?6.  Patient to follow in clinic as needed. ? ?Ellouise Newer, PA-C ?[facility] ?11/12/2021, 3:35 PM ? ?Cc: Burnard Hawthorne, FNP  ?

## 2021-11-12 NOTE — Patient Instructions (Addendum)
If you are age 41 or older, your body mass index should be between 23-30. Your Body mass index is 24.25 kg/m?Marland Kitchen If this is out of the aforementioned range listed, please consider follow up with your Primary Care Provider. ? ?If you are age 82 or younger, your body mass index should be between 19-25. Your Body mass index is 24.25 kg/m?Marland Kitchen If this is out of the aformentioned range listed, please consider follow up with your Primary Care Provider.  ? ?________________________________________________________ ? ?The [facility] providers would like to encourage you to use [facility] to communicate with providers for non-urgent requests or questions.  Due to long hold times on the telephone, sending your provider a message by [facility] may be a faster and more efficient way to get a response.  Please allow 48 business hours for a response.  Please remember that this is for non-urgent requests.  ?_______________________________________________________ ? ?You have been scheduled for an appointment with Carlena Hurl at [facility]. Your appointment is on 11-13-21 at 10:30am. Please arrive at 10:15am for registration. Make certain to bring a list of current medications, including any over the counter medications or vitamins. Also bring your co-pay if you have one as well as your insurance cards. [facility] is located at [address], [address]. Should you need to reschedule your appointment, please contact them at [phone]. ? ?We have sent the following medications to your pharmacy for you to pick up at your convenience: ? ?Hydrocortisone ointment twice daily for 7 to 14 days.  ? ?Please purchase the following medications over the counter and take as directed: ? ?Recticare with lidocaine over-the-counter as needed ? ?Sitz baths for 15 to 20 minutes 2-3 times per day. ? ?Remember to keep stools soft. ? ?Thank you for entrusting me with your care and choosing [facility]. ? ?Ellouise Newer,  PA-C ?

## 2021-11-13 DIAGNOSIS — K645 Perianal venous thrombosis: Secondary | ICD-10-CM | POA: Diagnosis not present

## 2021-12-01 DIAGNOSIS — Z124 Encounter for screening for malignant neoplasm of cervix: Secondary | ICD-10-CM | POA: Diagnosis not present

## 2021-12-01 DIAGNOSIS — Z01419 Encounter for gynecological examination (general) (routine) without abnormal findings: Secondary | ICD-10-CM | POA: Diagnosis not present

## 2022-03-12 DIAGNOSIS — N838 Other noninflammatory disorders of ovary, fallopian tube and broad ligament: Secondary | ICD-10-CM | POA: Diagnosis not present

## 2022-03-12 DIAGNOSIS — R1012 Left upper quadrant pain: Secondary | ICD-10-CM | POA: Diagnosis not present

## 2022-07-16 ENCOUNTER — Ambulatory Visit: Payer: 59 | Admitting: Dermatology

## 2022-08-13 ENCOUNTER — Other Ambulatory Visit: Payer: Self-pay | Admitting: Family Medicine

## 2022-09-22 DIAGNOSIS — U071 COVID-19: Secondary | ICD-10-CM | POA: Diagnosis not present

## 2022-09-22 DIAGNOSIS — J069 Acute upper respiratory infection, unspecified: Secondary | ICD-10-CM | POA: Diagnosis not present

## 2022-09-22 DIAGNOSIS — J101 Influenza due to other identified influenza virus with other respiratory manifestations: Secondary | ICD-10-CM | POA: Diagnosis not present

## 2022-09-22 DIAGNOSIS — R6889 Other general symptoms and signs: Secondary | ICD-10-CM | POA: Diagnosis not present

## 2022-09-22 DIAGNOSIS — Z1152 Encounter for screening for COVID-19: Secondary | ICD-10-CM | POA: Diagnosis not present

## 2022-10-02 DIAGNOSIS — N978 Female infertility of other origin: Secondary | ICD-10-CM | POA: Diagnosis not present

## 2022-10-22 ENCOUNTER — Ambulatory Visit: Payer: 59 | Admitting: Dermatology

## 2022-10-22 VITALS — BP 92/64 | HR 63

## 2022-10-22 DIAGNOSIS — L814 Other melanin hyperpigmentation: Secondary | ICD-10-CM | POA: Diagnosis not present

## 2022-10-22 DIAGNOSIS — L821 Other seborrheic keratosis: Secondary | ICD-10-CM | POA: Diagnosis not present

## 2022-10-22 DIAGNOSIS — Z79899 Other long term (current) drug therapy: Secondary | ICD-10-CM

## 2022-10-22 DIAGNOSIS — L578 Other skin changes due to chronic exposure to nonionizing radiation: Secondary | ICD-10-CM | POA: Diagnosis not present

## 2022-10-22 DIAGNOSIS — Z1283 Encounter for screening for malignant neoplasm of skin: Secondary | ICD-10-CM

## 2022-10-22 DIAGNOSIS — Z7189 Other specified counseling: Secondary | ICD-10-CM

## 2022-10-22 DIAGNOSIS — L700 Acne vulgaris: Secondary | ICD-10-CM

## 2022-10-22 DIAGNOSIS — D229 Melanocytic nevi, unspecified: Secondary | ICD-10-CM | POA: Diagnosis not present

## 2022-10-22 MED ORDER — CLINDAMYCIN PHOSPHATE 1 % EX LOTN: TOPICAL_LOTION | Freq: Every day | CUTANEOUS | 6 refills | Status: AC

## 2022-10-22 NOTE — Patient Instructions (Addendum)
Start over the counter Panoxyl wash apply to face daily for acne  Start Clindamycin lotion apply to face once a day for acne     Due to recent changes in healthcare laws, you may see results of your pathology and/or laboratory studies on MyChart before the doctors have had a chance to review them. We understand that in some cases there may be results that are confusing or concerning to you. Please understand that not all results are received at the same time and often the doctors may need to interpret multiple results in order to provide you with the best plan of care or course of treatment. Therefore, we ask that you please give [place] 2 business days to thoroughly review all your results before contacting the office for clarification. Should we see a critical lab result, you will be contacted sooner.   If You Need Anything After Your Visit  If you have any questions or concerns for your doctor, please call our main line at [phone] and press option 4 to reach your doctor's medical assistant. If no one answers, please leave a voicemail as directed and we will return your call as soon as possible. Messages left after 4 pm will be answered the following business day.   You may also send [place] a message via [place]. We typically respond to MyChart messages within 1-2 business days.  For prescription refills, please ask your pharmacy to contact our office. Our fax number is [phone].  If you have an urgent issue when the clinic is closed that cannot wait until the next business day, you can page your doctor at the number below.    Please note that while we do our best to be available for urgent issues outside of office hours, we are not available 24/7.   If you have an urgent issue and are unable to reach [place], you may choose to seek medical care at your doctor's office, retail clinic, urgent care center, or emergency room.  If you have a medical emergency, please immediately call 911 or go to the  emergency department.  Pager Numbers  - Dr. Nehemiah Massed: [phone]  - Dr. Laurence Ferrari: [phone]  - Dr. Nicole Kindred: [phone]  In the event of inclement weather, please call our main line at [phone] for an update on the status of any delays or closures.  Dermatology Medication Tips: Please keep the boxes that topical medications come in in order to help keep track of the instructions about where and how to use these. Pharmacies typically print the medication instructions only on the boxes and not directly on the medication tubes.   If your medication is too expensive, please contact our office at [phone] option 4 or send [place] a message through [place].   We are unable to tell what your co-pay for medications will be in advance as this is different depending on your insurance coverage. However, we may be able to find a substitute medication at lower cost or fill out paperwork to get insurance to cover a needed medication.   If a prior authorization is required to get your medication covered by your insurance company, please allow [place] 1-2 business days to complete this process.  Drug prices often vary depending on where the prescription is filled and some pharmacies may offer cheaper prices.  The website www.goodrx.com contains coupons for medications through different pharmacies. The prices here do not account for what the cost may be with help from insurance (it may be cheaper with your  insurance), but the website can give you the price if you did not use any insurance.  - You can print the associated coupon and take it with your prescription to the pharmacy.  - You may also stop by our office during regular business hours and pick up a GoodRx coupon card.  - If you need your prescription sent electronically to a different pharmacy, notify our office through [facility] or by phone at [phone] option 4.     Si Usted Necesita Algo Despus de Su Visita  Tambin puede  enviarnos un mensaje a travs de Pharmacist, community. Por lo general respondemos a los mensajes de MyChart en el transcurso de 1 a 2 das hbiles.  Para renovar recetas, por favor pida a su farmacia que se ponga en contacto con nuestra oficina. Harland Dingwall de fax es [place] [phone].  Si tiene un asunto urgente cuando la clnica est cerrada y que no puede esperar hasta el siguiente da hbil, puede llamar/localizar a su doctor(a) al nmero que aparece a continuacin.   Por favor, tenga en cuenta que aunque hacemos todo lo posible para estar disponibles para asuntos urgentes fuera del horario de [place], no estamos disponibles las 24 horas del da, los 7 das de la [place].   Si tiene un problema urgente y no puede comunicarse con nosotros, puede optar por buscar atencin mdica  en el consultorio de su doctor(a), en una clnica privada, en un centro de atencin urgente o en una sala de emergencias.  Si tiene Engineering geologist, por favor llame inmediatamente al 911 o vaya a la sala de emergencias.  Nmeros de bper  - Dr. Nehemiah Massed: [phone]  - Dra. Moye: [phone]  - Dra. Nicole Kindred: [phone]  En caso de inclemencias del [place], por favor llame a Johnsie Kindred principal al [phone] para una actualizacin sobre el [place] de cualquier retraso o cierre.  Consejos para la medicacin en dermatologa: Por favor, guarde las cajas en las que vienen los medicamentos de uso tpico para ayudarle a seguir las instrucciones sobre dnde y cmo usarlos. Las farmacias generalmente imprimen las instrucciones del medicamento slo en las cajas y no directamente en los tubos del [place].   Si su medicamento es muy caro, por favor, pngase en contacto con Zigmund Daniel llamando al [phone] y presione la opcin 4 o envenos un mensaje a travs de Pharmacist, community.   No podemos decirle cul ser su copago por los medicamentos por adelantado ya que esto es diferente dependiendo de la cobertura de su seguro.  Sin embargo, es posible que podamos encontrar un medicamento sustituto a Electrical engineer un formulario para que el seguro cubra el medicamento que se considera necesario.   Si se requiere una autorizacin previa para que su compaa de seguros [place] su medicamento, por favor permtanos de 1 a 2 das hbiles para completar este proceso.  Los precios de los medicamentos varan con frecuencia dependiendo del Environmental consultant de dnde se surte la receta y alguna farmacias pueden ofrecer precios ms baratos.  El sitio web www.goodrx.com tiene cupones para medicamentos de Airline pilot. Los precios aqu no tienen en cuenta lo que podra costar con la ayuda del seguro (puede ser ms barato con su seguro), pero el sitio web puede darle el precio si no utiliz Research scientist (physical sciences).  - Puede imprimir el cupn correspondiente y llevarlo con su receta a la farmacia.  - Tambin puede pasar por nuestra oficina durante el horario de atencin regular y Charity fundraiser una tarjeta de  de GoodRx.  - Si necesita que su receta se enve electrnicamente a una farmacia diferente, informe a nuestra oficina a travs de MyChart de [place] o por telfono llamando al [phone] y presione la opcin 4.  

## 2022-10-22 NOTE — Progress Notes (Signed)
   Follow-Up Visit   Subjective  Carolyn Chambers is a 42 y.o. female who presents for the following: Annual Exam. Hx of precancers The patient presents for Total-Body Skin Exam (TBSE) for skin cancer screening and mole check.  The patient has spots, moles and lesions to be evaluated, some may be new or changing and the patient has concerns that these could be cancer.  Patient trying to get pregnant   The following portions of the chart were reviewed this encounter and updated as appropriate:   Tobacco  Allergies  Meds  Problems  Med Hx  Surg Hx  Fam Hx     Review of Systems:  No other skin or systemic complaints except as noted in HPI or Assessment and Plan.  Objective  Well appearing patient in no apparent distress; mood and affect are within normal limits.  A full examination was performed including scalp, head, eyes, ears, nose, lips, neck, chest, axillae, abdomen, back, buttocks, bilateral upper extremities, bilateral lower extremities, hands, feet, fingers, toes, fingernails, and toenails. All findings within normal limits unless otherwise noted below.  face Erythematous papules and pustules with comedones    Assessment & Plan  Acne vulgaris face Chronic and persistent condition with duration or expected duration over one year. Condition is symptomatic / bothersome to patient. Not to goal.  Patient trying to get pregnant -advised patient that treatment will be very limited if patient is trying to get pregnant and or if she is pregnant as most medications for acne cannot be used during pregnancy  Start otc BPO wash daily such as PanOxyl 10 wash Start Clindamycin lotion apply to face once a day  Patient instructed to discuss acne regimen with her GYN so they are aware what she is using for acne and can improve it (or not).   Related Medications clindamycin (CLEOCIN-T) 1 % lotion Apply topically daily.  Lentigines - Scattered tan macules - Due to sun exposure -  Benign-appearing, observe - Recommend daily broad spectrum sunscreen SPF 30+ to sun-exposed areas, reapply every 2 hours as needed. - Call for any changes  Seborrheic Keratoses - Stuck-on, waxy, tan-brown papules and/or plaques  - Benign-appearing - Discussed benign etiology and prognosis. - Observe - Call for any changes  Melanocytic Nevi - Tan-brown and/or pink-flesh-colored symmetric macules and papules - Benign appearing on exam today - Observation - Call clinic for new or changing moles - Recommend daily use of broad spectrum spf 30+ sunscreen to sun-exposed areas.   Hemangiomas - Red papules - Discussed benign nature - Observe - Call for any changes  Actinic Damage - Chronic condition, secondary to cumulative UV/sun exposure - diffuse scaly erythematous macules with underlying dyspigmentation - Recommend daily broad spectrum sunscreen SPF 30+ to sun-exposed areas, reapply every 2 hours as needed.  - Staying in the shade or wearing long sleeves, sun glasses (UVA+UVB protection) and wide brim hats (4-inch brim around the entire circumference of the hat) are also recommended for sun protection.  - Call for new or changing lesions.  Skin cancer screening performed today.   Return in about 2 years (around 10/22/2024) for TBSE, hx of ak .  IMarye Round, CMA, am acting as scribe for Sarina Ser, MD .  Documentation: I have reviewed the above documentation for accuracy and completeness, and I agree with the above.  Sarina Ser, MD

## 2022-10-26 DIAGNOSIS — Z1329 Encounter for screening for other suspected endocrine disorder: Secondary | ICD-10-CM | POA: Diagnosis not present

## 2022-10-26 DIAGNOSIS — N978 Female infertility of other origin: Secondary | ICD-10-CM | POA: Diagnosis not present

## 2022-10-26 DIAGNOSIS — Z113 Encounter for screening for infections with a predominantly sexual mode of transmission: Secondary | ICD-10-CM | POA: Diagnosis not present

## 2022-10-26 DIAGNOSIS — Z3141 Encounter for fertility testing: Secondary | ICD-10-CM | POA: Diagnosis not present

## 2022-10-30 ENCOUNTER — Encounter: Payer: Self-pay | Admitting: Dermatology

## 2022-11-09 ENCOUNTER — Ambulatory Visit
Admission: RE | Admit: 2022-11-09 | Discharge: 2022-11-09 | Disposition: A | Discharge status: Home / Self Care | Payer: 59 | Source: Ambulatory Visit | Attending: Family | Admitting: Family

## 2022-11-09 ENCOUNTER — Other Ambulatory Visit: Payer: Self-pay | Admitting: Family

## 2022-11-09 DIAGNOSIS — Z1231 Encounter for screening mammogram for malignant neoplasm of breast: Secondary | ICD-10-CM

## 2022-11-11 DIAGNOSIS — N979 Female infertility, unspecified: Secondary | ICD-10-CM | POA: Diagnosis not present

## 2022-11-14 DIAGNOSIS — N978 Female infertility of other origin: Secondary | ICD-10-CM | POA: Diagnosis not present

## 2022-11-16 DIAGNOSIS — N979 Female infertility, unspecified: Secondary | ICD-10-CM | POA: Diagnosis not present

## 2022-11-18 DIAGNOSIS — N978 Female infertility of other origin: Secondary | ICD-10-CM | POA: Diagnosis not present

## 2022-11-18 DIAGNOSIS — N979 Female infertility, unspecified: Secondary | ICD-10-CM | POA: Diagnosis not present

## 2022-11-20 DIAGNOSIS — N978 Female infertility of other origin: Secondary | ICD-10-CM | POA: Diagnosis not present

## 2022-11-20 DIAGNOSIS — N979 Female infertility, unspecified: Secondary | ICD-10-CM | POA: Diagnosis not present

## 2022-11-22 DIAGNOSIS — N978 Female infertility of other origin: Secondary | ICD-10-CM | POA: Diagnosis not present

## 2022-11-24 DIAGNOSIS — N979 Female infertility, unspecified: Secondary | ICD-10-CM | POA: Diagnosis not present

## 2022-12-08 DIAGNOSIS — Z124 Encounter for screening for malignant neoplasm of cervix: Secondary | ICD-10-CM | POA: Diagnosis not present

## 2022-12-25 DIAGNOSIS — Z3183 Encounter for assisted reproductive fertility procedure cycle: Secondary | ICD-10-CM | POA: Diagnosis not present

## 2023-01-11 DIAGNOSIS — N979 Female infertility, unspecified: Secondary | ICD-10-CM | POA: Diagnosis not present

## 2023-01-11 DIAGNOSIS — Z0183 Encounter for blood typing: Secondary | ICD-10-CM | POA: Diagnosis not present

## 2023-01-11 DIAGNOSIS — Z3141 Encounter for fertility testing: Secondary | ICD-10-CM | POA: Diagnosis not present

## 2023-01-19 DIAGNOSIS — N979 Female infertility, unspecified: Secondary | ICD-10-CM | POA: Diagnosis not present

## 2023-02-17 DIAGNOSIS — N979 Female infertility, unspecified: Secondary | ICD-10-CM | POA: Diagnosis not present

## 2023-10-28 ENCOUNTER — Ambulatory Visit: Payer: 59 | Admitting: Dermatology

## 2023-11-16 ENCOUNTER — Ambulatory Visit: Payer: 59 | Admitting: Dermatology
